# Patient Record
Sex: Male | Born: 2000 | Race: Black or African American | Hispanic: No | Marital: Single | Smoking: Never smoker
Health system: Southern US, Community
[De-identification: ages and names within clinical notes are randomized; demographics above are authoritative.]

## PROBLEM LIST (undated history)

## (undated) ENCOUNTER — Ambulatory Visit: Payer: PRIVATE HEALTH INSURANCE

## (undated) ENCOUNTER — Ambulatory Visit (HOSPITAL_COMMUNITY): Source: Home / Self Care

## (undated) DIAGNOSIS — E109 Type 1 diabetes mellitus without complications: Secondary | ICD-10-CM

## (undated) HISTORY — DX: Type 1 diabetes mellitus without complications: E10.9

---
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
## 2022-01-29 ENCOUNTER — Encounter (INDEPENDENT_AMBULATORY_CARE_PROVIDER_SITE_OTHER): Payer: Self-pay | Admitting: Pediatrics

## 2022-02-13 ENCOUNTER — Other Ambulatory Visit (INDEPENDENT_AMBULATORY_CARE_PROVIDER_SITE_OTHER): Payer: BC Managed Care – PPO | Admitting: Pharmacist

## 2022-02-13 NOTE — Progress Notes (Deleted)
? ?  S:    ? ?No chief complaint on file. ? ? ?Endocrinology provider: Dr. Charna Archer (upcoming appt 03/21/22 10:15 am) ? ?Patient has decided to initiate process to start an Omnipod 5 insulin pump. PMH significant for T1DM.  ? ?Patient presents today with ***. ? ?Insurance Coverage: *** ? ?DME Supplier: *** ? ?Preferred Pharmacy ?*** ? ?Medication Adherence ?-Patient {Actions; denies-reports:120008} adherence with medications.  ?-Current diabetes medications include: *** ?-Prior diabetes medications include: *** ? ? ?Pre-pump Topics ?Insulin Pump Basics ?Sick Day Management ?Pump Failure ?Travel  ?Pump Start Instructions  ? ?Prepump Survey Responses ?Email address: *** ?Long acting insulin, dose, time administered: *** ?Rapid acting insulin: *** ?Breakfast time: *** ?Lunch time: *** ?Dinner time: *** ?I wake up in the morning at: *** AM ?I go to bed at: *** ?I feel I need an insulin dose adjustment: *** ? ?Labs:  ? ? ?There were no vitals filed for this visit. ? ?HbA1c ?Lab Results  ?Component Value Date  ? HGBA1C >14 12/11/2021  ? HGBA1C 10.6 (A) 09/11/2021  ? HGBA1C 14.0 (A) 03/14/2021  ? ? ?Pancreatic Islet Cell Autoantibodies ?Lab Results  ?Component Value Date  ? ISLETAB Negative 12/08/2018  ? ? ?Insulin Autoantibodies ?Lab Results  ?Component Value Date  ? INSULINAB 6.3 (H) 12/08/2018  ? ? ?Glutamic Acid Decarboxylase Autoantibodies ?Lab Results  ?Component Value Date  ? GLUTAMICACAB 632.9 (H) 12/08/2018  ? ? ?ZnT8 Autoantibodies ?No results found for: ZNT8AB ? ?IA-2 Autoantibodies ?No results found for: LABIA2 ? ?C-Peptide ?Lab Results  ?Component Value Date  ? CPEPTIDE 0.7 (L) 12/08/2018  ? ? ?Microalbumin ?Lab Results  ?Component Value Date  ? MICRALBCREAT 6 03/14/2021  ? ? ?Lipids ?   ?Component Value Date/Time  ? CHOL 212 (H) 03/14/2021 1435  ? TRIG 143 (H) 03/14/2021 1435  ? HDL 56 03/14/2021 1435  ? CHOLHDL 3.8 03/14/2021 1435  ? LDLCALC 130 (H) 03/14/2021 1435  ? ? ?Assessment: ?Education - Thoroughly  discussed all pre-pump topics (insulin pump basics, sick day management, pump failure, travel, and pump start instructions). Patient/family had concerns related to ***; therefore, discussed topics in depth until family felt confident with understanding of topics.  ? ?Pump Start Instructions - Sent prescription for *** vial to patient's preferred pharmacy. The patient/family understand that the family should bring all insulin pump supplies as well as insulin vial to pump start appointment. Advised patient to *** long acting insulin on ***.  ? ?Plan: ?Pre-Pump Education ?Discussed all pre-pump topics (insulin pump basics, sick day management, pump failure, travel, and pump start instructions) until family felt confident in their understanding of each topic.  ?Pump Start Appointment ?Sent prescription for *** vial to patient's preferred pharmacy.  ?The patient/family understand that the family should bring all insulin pump supplies as well as insulin vial to pump start appointment.  ?Advised patient to *** long acting insulin on ***.  ?Follow Up:  ? ?Written patient instructions provided.   ? ?This appointment required *** minutes of patient care (this includes precharting, chart review, review of results, face-to-face care, etc.). ? ?Thank you for involving clinical pharmacist/diabetes educator to assist in providing this patient's care. ? ?Drexel Iha, PharmD, BCACP, [place], CPP ? ? ?

## 2022-02-26 ENCOUNTER — Telehealth (INDEPENDENT_AMBULATORY_CARE_PROVIDER_SITE_OTHER): Payer: Self-pay | Admitting: Pharmacist

## 2022-02-26 NOTE — Telephone Encounter (Signed)
Patient did not attend prepump appt on 02/13/22 therefore cannot attend pump start appt on 02/28/22 ? ?Please contact patient to reschedule prepump appt (after he attends this I will scheudle pump training appt) on Tues/Thurs 1:30, 2:30, 3:30 pm in April 2023. ? ?Thank you for involving clinical pharmacist/diabetes educator to assist in providing this patient's care.  ? ?Zachery Conch, PharmD, BCACP, CDCES, CPP ? ?

## 2022-02-27 ENCOUNTER — Encounter (INDEPENDENT_AMBULATORY_CARE_PROVIDER_SITE_OTHER): Payer: Self-pay | Admitting: Pharmacist

## 2022-02-28 ENCOUNTER — Other Ambulatory Visit (INDEPENDENT_AMBULATORY_CARE_PROVIDER_SITE_OTHER): Payer: BC Managed Care – PPO | Admitting: Pharmacist

## 2022-03-15 ENCOUNTER — Encounter (INDEPENDENT_AMBULATORY_CARE_PROVIDER_SITE_OTHER): Payer: Self-pay | Admitting: Pediatrics

## 2022-03-18 ENCOUNTER — Encounter (INDEPENDENT_AMBULATORY_CARE_PROVIDER_SITE_OTHER): Payer: Self-pay | Admitting: Pediatric Endocrinology

## 2022-03-21 ENCOUNTER — Ambulatory Visit (INDEPENDENT_AMBULATORY_CARE_PROVIDER_SITE_OTHER): Payer: BC Managed Care – PPO | Admitting: Pediatrics

## 2022-03-21 ENCOUNTER — Encounter (INDEPENDENT_AMBULATORY_CARE_PROVIDER_SITE_OTHER): Payer: Self-pay | Admitting: Pediatrics

## 2022-03-21 VITALS — BP 118/76 | HR 82 | Wt 142.8 lb

## 2022-03-21 DIAGNOSIS — Z794 Long term (current) use of insulin: Secondary | ICD-10-CM

## 2022-03-21 DIAGNOSIS — E10649 Type 1 diabetes mellitus with hypoglycemia without coma: Secondary | ICD-10-CM | POA: Diagnosis not present

## 2022-03-21 DIAGNOSIS — E1065 Type 1 diabetes mellitus with hyperglycemia: Secondary | ICD-10-CM | POA: Diagnosis not present

## 2022-03-21 LAB — POCT GLYCOSYLATED HEMOGLOBIN (HGB A1C): HbA1c POC (<> result, manual entry): 9.3 % (ref 4.0–5.6)

## 2022-03-21 LAB — POCT GLUCOSE (DEVICE FOR HOME USE): POC Glucose: 200 mg/dl — AB (ref 70–99)

## 2022-03-21 NOTE — Progress Notes (Signed)
Pediatric Endocrinology Consultation Follow-up Visit ? ?Michael [name] ?[birth year] ?[id number] ? ?Chief Complaint: type 1 diabetes ? ?HPI: ?Michael [name] is a 21 y.o. male presenting for follow-up of the above concerns.  he attended this visit alone. ? ?1. Omari was diagnosed with T1DM on 12/08/2018 (presented to [facility] with DKA initially).  At presentation, A1c elevated at 11.2%, C-peptide low at 0.7, islet cell ab negative, insulin Ab elevated at 6.3 (normal <5), GAD ab positive at 632.9 (normal <5), celiac screen negative.  He was started on an MDI regimen and discharged from the hospital on 12/11/18.  He started on a dexcom CGM in 01/2019. ? ?2. Since last visit on 12/11/21, he has been OK.   ?ED visits/Hospitalizations: None ? ?Concerns:  ?-Having many lows recently.  Sent mychart message while I was out of office and reports Dr. Baldo Ash told him to come to his appt.  (Dr. Baldo Ash also recommended decreasing his long-acting insulin dose but he did not read this mychart message). ?-He thinks lows are due to not eating as many carbs recently. ?-Was set up to get omnipod 5 training though mom and grandmother are skeptical and told him not to get a pump.  He still wants one and may get one.  Discussed training process. ? ?Insulin regimen:  ?Tresiba 28 units daily before bed (10PM) ?Novolog 120/30/6 plan.  ?BF-8 units ?L-Doesn't eat next meal until 6PM.  Sandwich for dinner ?D- 9 units  ? ?CGM download:  ? ? ?Many lows overnight, needs less tresiba ? ?Hypoglycemia: can feel lows, many recently.  No glucagon needed recently. Has Baqsimi ?Wearing Med-alert ID currently: has one in car ?Injection sites: Legs; hurts less than arms ?Annual labs due: 03/2022-Will draw at next visit ?Ophthalmology due: Has appt Tuesday. ? ?ROS: ?All systems reviewed with pertinent positives listed below; otherwise negative. ?Constitutional: Weight has increased 4lb since last visit.     ? ?Working 4 days per week (M-Th) ?[employer] 9AM-2PM, other  job 3P-1AM  ? ?Works out Fri-Sun ? 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 ?Past Medical History:   ?Past Medical History:  ?Diagnosis Date  ? Type 1 diabetes ([place])   ? Dx 12/2018, presented with DKA.  + Insulin Ab and GAD Ab.  Low C-peptide.  Negative celiac screen.  ? ? ?Meds: ?Outpatient Encounter Medications as of 03/21/2022  ?Medication Sig  ? BD PEN NEEDLE NANO 2ND GEN 32G X 4 MM MISC USE WITH INSULIN PEN 6 TIMES DAILY  ? Continuous Blood Gluc Sensor (DEXCOM G6 SENSOR) MISC WEAR CONTINUOSLY FOR 10 DAYS THEN REMOVE AND PLACE NEW SENSOR  ? Continuous Blood Gluc Transmit (DEXCOM G6 TRANSMITTER) MISC Use with Dexcom Sensor, reuse for 3 months  ? insulin degludec (TRESIBA FLEXTOUCH) 100 UNIT/ML FlexTouch Pen INJECT AS DIRECTED BY MD, UP TO 50 UNITS DAILY  ? NOVOLOG FLEXPEN 100 UNIT/ML FlexPen INJECT UP TO 50 UNITS DAILY PER PROVIDER INSTRUCTION  ? ACCU-CHEK FASTCLIX LANCETS MISC Check sugar 10 x daily (Patient not taking: Reported on 03/21/2022)  ? acetaminophen (TYLENOL) 500 MG tablet Take 500 mg by mouth every 6 (six) hours as needed for mild pain. (Patient not taking: Reported on 03/21/2022)  ? acetone, urine, test strip Check ketones per protocol (Patient not taking: Reported on 12/11/2021)  ? Alcohol Swabs (ALCOHOL PADS) 70 % PADS Wipe skin prior to injection (Patient not taking: Reported on 03/21/2022)  ? Glucagon (BAQSIMI TWO PACK) 3 MG/DOSE POWD Place 1 application into the nose as  needed. Use as directed if unconscious, unable to take food po, or having a seizure due to hypoglycemia (Patient not taking: Reported on 12/11/2021)  ? glucose blood (ACCU-CHEK GUIDE) test strip Use to check BG 6 times daily (Patient not taking: Reported on 03/21/2022)  ? insulin aspart (NOVOLOG) 100 UNIT/ML injection Inject up to 200 units into insulin pump every 2 days. Please fill for insulin VIAL. (Patient not taking: Reported on 03/21/2022)  ? Insulin Disposable Pump (OMNIPOD 5 G6 INTRO, GEN 5,) KIT Inject 1 kit  into the skin as directed. . Change pod every 2 days. Intro kit comes with 2 boxes of pods, PDM device, pod pals, and user manual. Please fill for Omnipod 5 Into kit NDC [phone] (Patient not taking: Reported on 03/21/2022)  ? Insulin Glargine (LANTUS SOLOSTAR) 100 UNIT/ML Solostar Pen Up to 50 units per day as directed by MD (Patient not taking: Reported on 12/11/2021)  ? Multiple Vitamins-Minerals (MULTIVITAMIN ADULT, MINERALS,) TABS Take by mouth. (Patient not taking: Reported on 03/21/2022)  ? Nutritional Supplements (CREATINE) 750 MG CAPS Take by mouth. (Patient not taking: Reported on 03/21/2022)  ? ?No facility-administered encounter medications on file as of 03/21/2022.  ? ?Allergies: ?Allergies  ?Allergen Reactions  ? Penicillins Hives and Swelling  ?  DID THE REACTION INVOLVE: Swelling of the face/tongue/throat, SOB, or low BP? Yes ?Sudden or severe rash/hives, skin peeling, or the inside of the mouth or nose? Yes ?Did it require medical treatment? Yes ?When did it last happen?       ?If all above answers are ?NO?, may proceed with cephalosporin use. ?  ? ?Surgical History: ?History reviewed. No pertinent surgical history.  ? ?Family History:  ?Family History  ?Problem Relation Age of Onset  ? Diabetes Paternal Aunt   ? Diabetes Paternal Uncle   ? Diabetes Paternal Grandmother   ? 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 ?Physical Exam:  ?Vitals:  ? 03/21/22 1005  ?BP: 118/76  ?Pulse: 82  ?Weight: 142 lb 12.8 oz (64.8 kg)  ? ? ?BP 118/76   Pulse 82   Wt 142 lb 12.8 oz (64.8 kg)   BMI 21.89 kg/m?  ?Body mass index: body mass index is 21.89 kg/m?. ?Blood pressure percentiles are not available for patients who are 18 years or older. ? ?Wt Readings from Last 3 Encounters:  ?03/21/22 142 lb 12.8 oz (64.8 kg) (33 %, Z= -0.43)*  ?12/11/21 138 lb (62.6 kg) (27 %, Z= -0.62)*  ?09/11/21 145 lb (65.8 kg) (40 %, Z= -0.24)*  ? ?* Growth  percentiles are based on CDC (Boys, 2-20 Years) data.  ? ?Ht Readings from Last 3 Encounters:  ?12/11/21 5' 7.72" (1.72 m) (26 %, Z= -0.63)*  ?09/11/21 5' 7.56" (1.716 m) (25 %, Z= -0.68)*  ?11/09/20 5' 8.5" (1.74 m) (39 %, Z= -0.27)*  ? ?* Growth percentiles are based on CDC (Boys, 2-20 Years) data.  ? ?General: Well developed, well nourished male in no acute distress.  Appears stated age ?Head: Normocephalic, atraumatic.   ?Eyes:  Pupils equal and round. EOMI.  Sclera white.  No eye drainage.   ?Ears/Nose/Mouth/Throat: Nares patent, no nasal drainage.  Normal dentition, mucous membranes moist.  ?Neck: supple, no cervical lymphadenopathy, no thyromegaly ?Cardiovascular: regular rate, normal S1/S2, no murmurs ?Respiratory: No increased work of breathing.  Lungs clear to auscultation bilaterally.  No wheezes. ?Abdomen: soft, nontender, nondistended. Normal bowel sounds.  No appreciable masses  ?  Extremities: warm, well perfused, cap refill < 2 sec.   ?Musculoskeletal: Normal muscle mass.  Normal strength ?Skin: warm, dry.  No rash or lesions. ?Neurologic: alert and oriented, normal speech, no tremor  ? ?Labs: ?Results for orders placed or performed in visit on 03/21/22  ?POCT Glucose (Device for Home Use)  ?Result Value Ref Range  ? Glucose Fasting, POC    ? POC Glucose 200 (A) 70 - 99 mg/dl  ?POCT glycosylated hemoglobin (Hb A1C)  ?Result Value Ref Range  ? Hemoglobin A1C    ? HbA1c POC (<> result, manual entry) 9.3 4.0 - 5.6 %  ? HbA1c, POC (prediabetic range)    ? HbA1c, POC (controlled diabetic range)    ? ?A1c trend: 11.7% 12/2018 at diagnosis--> 11.2% 08/2019--> 12.9% 11/2019--> 9.4% 01/2020--> 10.2% 05/2020-->9.7% 07/2020--> 8.2% 09/2020--> 9% 11/09/20--> 14% 03/2021--> 10.6% 09/2021--> >14% 12/2021-->9.3% 03/2022 ? ?Assessment/Plan: ?Desmond Szabo is a 21 y.o. male with T1DM on an MDI and CGM regimen.   ?A1c is lower than last visit and is above the ADA goal of <7.0%. Dexcom tracing shows he is not meeting goal of TIR  >70%. he needs less tresiba.   ? ?When a patient is on insulin, intensive monitoring of blood glucose levels and continuous insulin titration is vital to avoid insulin toxicity leading to severe hypoglycemia. Se

## 2022-03-21 NOTE — Patient Instructions (Addendum)
It was a pleasure to see you in clinic today.   ?Feel free to contact our office during normal business hours at [phone] with questions or concerns. ?If you need [place] urgently after normal business hours, please call the above number to reach our answering service who will contact the on-call pediatric endocrinologist. ? ?If you choose to communicate with [place] via MyChart, please do not send urgent messages as this inbox is NOT monitored on nights or weekends.  Urgent concerns should be discussed with the on-call pediatric endocrinologist. ? ?-Always have fast sugar with you in case of low blood sugar (glucose tabs, regular juice or soda, candy) ?-Always wear your ID that states you have diabetes ?-Always bring your meter/continuous glucose monitor to your visit ?-Call/Email if you want to review blood sugars ? ? ?Reduce tresiba to 25 units daily ?

## 2022-03-22 ENCOUNTER — Other Ambulatory Visit (INDEPENDENT_AMBULATORY_CARE_PROVIDER_SITE_OTHER): Payer: Self-pay | Admitting: Pediatrics

## 2022-03-22 DIAGNOSIS — E1010 Type 1 diabetes mellitus with ketoacidosis without coma: Secondary | ICD-10-CM

## 2022-04-08 ENCOUNTER — Other Ambulatory Visit (INDEPENDENT_AMBULATORY_CARE_PROVIDER_SITE_OTHER): Payer: Self-pay | Admitting: Pediatrics

## 2022-04-08 DIAGNOSIS — E109 Type 1 diabetes mellitus without complications: Secondary | ICD-10-CM

## 2022-05-28 ENCOUNTER — Other Ambulatory Visit (INDEPENDENT_AMBULATORY_CARE_PROVIDER_SITE_OTHER): Payer: Self-pay | Admitting: Pediatrics

## 2022-05-28 DIAGNOSIS — E1010 Type 1 diabetes mellitus with ketoacidosis without coma: Secondary | ICD-10-CM

## 2022-06-19 ENCOUNTER — Encounter (INDEPENDENT_AMBULATORY_CARE_PROVIDER_SITE_OTHER): Payer: Self-pay | Admitting: Pediatrics

## 2022-06-19 ENCOUNTER — Ambulatory Visit (INDEPENDENT_AMBULATORY_CARE_PROVIDER_SITE_OTHER): Payer: BC Managed Care – PPO | Admitting: Pediatrics

## 2022-06-19 VITALS — BP 122/72 | HR 80 | Wt 142.0 lb

## 2022-06-19 DIAGNOSIS — E1065 Type 1 diabetes mellitus with hyperglycemia: Secondary | ICD-10-CM | POA: Diagnosis not present

## 2022-06-19 DIAGNOSIS — Z794 Long term (current) use of insulin: Secondary | ICD-10-CM

## 2022-06-19 LAB — POCT GLYCOSYLATED HEMOGLOBIN (HGB A1C): Hemoglobin A1C: 9.1 % — AB (ref 4.0–5.6)

## 2022-06-19 LAB — POCT GLUCOSE (DEVICE FOR HOME USE): POC Glucose: 47 mg/dl — AB (ref 70–99)

## 2022-06-19 NOTE — Patient Instructions (Addendum)
It was a pleasure to see you in clinic today.   Feel free to contact our office during normal business hours at [phone] with questions or concerns. If you have an emergency after normal business hours, please call the above number to reach our answering service who will contact the on-call pediatric endocrinologist.  If you choose to communicate with [place] via MyChart, please do not send urgent messages as this inbox is NOT monitored on nights or weekends.  Urgent concerns should be discussed with the on-call pediatric endocrinologist.  -Always have fast sugar with you in case of low blood sugar (glucose tabs, regular juice or soda, candy) -Always wear your ID that states you have diabetes -Always bring your meter/continuous glucose monitor to your visit -Call/Email if you want to review blood sugars   Cut tresiba dose to 25 units daily

## 2022-06-19 NOTE — Progress Notes (Addendum)
Pediatric Endocrinology Consultation Follow-up Visit  Michael [name] Oct 19, 2001 [id number]  Chief Complaint: type 1 diabetes  HPI: Michael [name] is a 21 y.o. male presenting for follow-up of the above concerns.  he attended this visit alone.  28. Michael [name] was diagnosed with T1DM on 12/08/2018 (presented to [facility] with DKA initially).  At presentation, A1c elevated at 11.2%, C-peptide low at 0.7, islet cell ab negative, insulin Ab elevated at 6.3 (normal <5), GAD ab positive at 632.9 (normal <5), celiac screen negative.  He was started on an MDI regimen and discharged from the hospital on 12/11/18.  He started on a dexcom CGM in 01/2019.  2. Since last visit on 03/21/22, he has been well.   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   -Got his own apartment -Playing in 2 basketball leagues Not going back to college -Interested in a closed loop pump  Insulin regimen:  Tresiba 28 units daily before bed (10PM) Having a lot of lows, more than he's had in the past.  Still eating well. Novolog 120/30/6 plan.  BF-8 units ([employer]), hashbrowns, SEC biscuit, OJ L-at 2PM, fast food D- home cooked meals, rice, takes 10 units   CGM download:    Interpretation: having many lows, needs less tresiba  Hypoglycemia: can feel lows, many recently.  No glucagon needed recently. Has Baqsimi Wearing Med-alert ID currently: not currently Injection sites: Leg, arms Annual labs due: 03/2022-Will draw today Ophthalmology due: Recent visit- no retina concerns  ROS: All systems reviewed with pertinent positives listed below; otherwise negative. Constitutional: Weight has stable from last visit. Eating out more, home cooked meal  Past Medical History:   Past Medical History:  Diagnosis Date   Type 1 diabetes ([place])    Dx 12/2018, presented with DKA.  + Insulin Ab and GAD Ab.  Low C-peptide.  Negative celiac screen.    Meds: Outpatient Encounter Medications  as of 06/19/2022  Medication Sig   acetaminophen (TYLENOL) 500 MG tablet Take 500 mg by mouth every 6 (six) hours as needed for mild pain.   Alcohol Swabs (ALCOHOL PADS) 70 % PADS Wipe skin prior to injection   BD PEN NEEDLE NANO 2ND GEN 32G X 4 MM MISC USE WITH INSULIN PEN 6 TIMES DAILY   Continuous Blood Gluc Sensor (DEXCOM G6 SENSOR) MISC WEAR CONTINUOUSLY FOR 10 DAYS TABLET;HEN REMOVE AND REPLACE WITH A NEW SENSOR   Continuous Blood Gluc Transmit (DEXCOM G6 TRANSMITTER) MISC USE WITH DEXCOM SENSOR, REUSE FOR 3 MONTHS   insulin degludec (TRESIBA FLEXTOUCH) 100 UNIT/ML FlexTouch Pen ADMINISTER UP TO 50 UNITS UNDER THE SKIN DAILY AS DIRECTED BY PRESCRIBER   Multiple Vitamins-Minerals (MULTIVITAMIN ADULT, MINERALS,) TABS Take by mouth.   NOVOLOG FLEXPEN 100 UNIT/ML FlexPen INJECT UP TO 50 UNITS DAILY PER PROVIDER INSTRUCTION   ACCU-CHEK FASTCLIX LANCETS MISC Check sugar 10 x daily (Patient not taking: Reported on 03/21/2022)   acetone, urine, test strip Check ketones per protocol (Patient not taking: Reported on 12/11/2021)   Glucagon (BAQSIMI TWO PACK) 3 MG/DOSE POWD Place 1 application into the nose as needed. Use as directed if unconscious, unable to take food po, or having a seizure due to hypoglycemia (Patient not taking: Reported on 12/11/2021)   glucose blood (ACCU-CHEK GUIDE) test strip Use to check BG 6 times daily (Patient not taking: Reported on 03/21/2022)   insulin aspart (NOVOLOG) 100 UNIT/ML injection Inject up to 200 units into insulin pump every 2 days. Please fill for insulin  VIAL. (Patient not taking: Reported on 06/19/2022)   Insulin Disposable Pump (OMNIPOD 5 G6 INTRO, GEN 5,) KIT Inject 1 kit into the skin as directed. . Change pod every 2 days. Intro kit comes with 2 boxes of pods, PDM device, pod pals, and user manual. Please fill for Omnipod 5 Into kit NDC [phone] (Patient not taking: Reported on 03/21/2022)   Insulin Glargine (LANTUS SOLOSTAR) 100 UNIT/ML Solostar Pen Up to  50 units per day as directed by MD (Patient not taking: Reported on 12/11/2021)   [DISCONTINUED] Nutritional Supplements (CREATINE) 750 MG CAPS Take by mouth. (Patient not taking: Reported on 03/21/2022)   No facility-administered encounter medications on file as of 06/19/2022.   Allergies: Allergies  Allergen Reactions   Penicillins Hives and Swelling    DID THE REACTION INVOLVE: Swelling of the face/tongue/throat, SOB, or low BP? Yes Sudden or severe rash/hives, skin peeling, or the inside of the mouth or nose? Yes Did it require medical treatment? Yes When did it last happen?       If all above answers are "NO", may proceed with cephalosporin use.    Surgical History: History reviewed. No pertinent surgical history.   Family History:  Family History  Problem Relation Age of Onset   Diabetes Paternal Aunt    Diabetes Paternal Uncle    Diabetes Paternal Grandmother    Social History: Lives with: parents  Physical Exam:  Vitals:   06/19/22 0922  BP: 122/72  Pulse: 80  Weight: 142 lb (64.4 kg)    BP 122/72   Pulse 80   Wt 142 lb (64.4 kg)   BMI 21.77 kg/m  Body mass index: body mass index is 21.77 kg/m. Blood pressure %iles are not available for patients who are 18 years or older.  Wt Readings from Last 3 Encounters:  06/19/22 142 lb (64.4 kg) (30 %, Z= -0.51)*  03/21/22 142 lb 12.8 oz (64.8 kg) (33 %, Z= -0.43)*  12/11/21 138 lb (62.6 kg) (27 %, Z= -0.62)*   * Growth percentiles are based on CDC (Boys, 2-20 Years) data.   Ht Readings from Last 3 Encounters:  12/11/21 5' 7.72" (1.72 m) (26 %, Z= -0.63)*  09/11/21 5' 7.56" (1.716 m) (25 %, Z= -0.68)*  11/09/20 5' 8.5" (1.74 m) (39 %, Z= -0.27)*   * Growth percentiles are based on CDC (Boys, 2-20 Years) data.   General: Well developed, well nourished male in no acute distress.  Appears stated age Head: Normocephalic, atraumatic.   Eyes:  Pupils equal and round. EOMI.   Sclera white.  No eye drainage.    Ears/Nose/Mouth/Throat: Nares patent, no nasal drainage.  Moist mucous membranes, normal dentition Neck: supple, no cervical lymphadenopathy, no thyromegaly Cardiovascular: regular rate, normal S1/S2, no murmurs Respiratory: No increased work of breathing.  Lungs clear to auscultation bilaterally.  No wheezes. Abdomen: soft, nontender, nondistended.  Extremities: warm, well perfused, cap refill < 2 sec.   Musculoskeletal: Normal muscle mass.  Normal strength Skin: warm, dry.  No rash or lesions. Skin normal at injection sites Neurologic: alert and oriented, normal speech, no tremor   Labs: Results for orders placed or performed in visit on 06/19/22  POCT Glucose (Device for Home Use)  Result Value Ref Range   Glucose Fasting, POC     POC Glucose 47 (A) 70 - 99 mg/dl  POCT glycosylated hemoglobin (Hb A1C)  Result Value Ref Range   Hemoglobin A1C 9.1 (A) 4.0 - 5.6 %   HbA1c POC (<>  result, manual entry)     HbA1c, POC (prediabetic range)     HbA1c, POC (controlled diabetic range)    Repeat BG 88 at 9:45AM  A1c trend: 11.7% 12/2018 at diagnosis--> 11.2% 08/2019--> 12.9% 11/2019--> 9.4% 01/2020--> 10.2% 05/2020-->9.7% 07/2020--> 8.2% 09/2020--> 9% 11/09/20--> 14% 03/2021--> 10.6% 09/2021--> >14% 12/2021-->9.3% 03/2022--> 9.1% 06/2022  Assessment/Plan: Michael [name] is a 21 y.o. male with T1DM on an MDI and intermittent CGM regimen.   A1c is lower than last visit and is above the ADA goal of <7.0%. Dexcom tracing shows he is not meeting goal of TIR >70%. he needs less tresiba.  He is having too many lows and would benefit from a closed loop pump.    When a patient is on insulin, intensive monitoring of blood glucose levels and continuous insulin titration is vital to avoid insulin toxicity leading to severe hypoglycemia. Severe hypoglycemia can lead to seizure or death. Hyperglycemia can also result from inadequate insulin dosing and can lead to ketosis requiring ICU admission and intravenous  insulin.   1. Type 1 diabetes with hyperglycemia - POCT Glucose and POCT HgB A1C as above -Will draw annual diabetes labs today (lipid panel, TSH, FT4, urine microalbumin to creatinine ratio) -Encouraged to wear med alert ID every day -Encouraged to rotate injection sites -Provided with my contact information and advised to email/send mychart with questions/need for BG review -CGM download reviewed extensively (see interpretation above) -Interested in a pump- advised to schedule with [facility] for pre-pump  2. Insulin dose change -Made the following insulin changes: Reduce tresiba to 25 units Let me know if he is still running low so I can make adjustments   Follow-up:   Return in about 3 months (around 09/19/2022).   >30 minutes spent today reviewing the medical chart, counseling the patient/family, and documenting today's encounter.  Levon Hedger, MD  -------------------------------- 06/20/22 8:42 AM ADDENDUM: Results for orders placed or performed in visit on 06/19/22  Lipid panel  Result Value Ref Range   Cholesterol 242 (H) <170 mg/dL   HDL 55 >45 mg/dL   Triglycerides 71 <90 mg/dL   LDL Cholesterol (Calc) 170 (H) <110 mg/dL (calc)   Total CHOL/HDL Ratio 4.4 <5.0 (calc)   Non-HDL Cholesterol (Calc) 187 (H) <120 mg/dL (calc)  Microalbumin / creatinine urine ratio  Result Value Ref Range   Creatinine, Urine 276 20 - 320 mg/dL   Microalb, Ur 1.1 mg/dL   Microalb Creat Ratio 4 <30 mcg/mg creat  T4, free  Result Value Ref Range   Free T4 1.1 0.8 - 1.4 ng/dL  TSH  Result Value Ref Range   TSH 1.10 0.50 - 4.30 mIU/L  POCT Glucose (Device for Home Use)  Result Value Ref Range   Glucose Fasting, POC     POC Glucose 47 (A) 70 - 99 mg/dl  POCT glycosylated hemoglobin (Hb A1C)  Result Value Ref Range   Hemoglobin A1C 9.1 (A) 4.0 - 5.6 %   HbA1c POC (<> result, manual entry)     HbA1c, POC (prediabetic range)     HbA1c, POC (controlled diabetic range)    Sent  the following mychart message:  Hi, Your thyroid labs and urine protein levels are normal. Your cholesterol has increased and your LDL (the "bad" cholesterol) has increased and is currently at a point that cholesterol medicine should be started.  However, before we start a cholesterol medicine, I want you to work on eating out less, cutting back on fried foods, and  work on getting your blood sugars under better control.  Please do those things for the next 3 months and we will repeat your lipid panel at next visit.  If your levels are still too high at that point, we will start a cholesterol medicine. Please let me know if you have questions! Dr. Charna Archer

## 2022-06-20 LAB — TSH: TSH: 1.1 mIU/L (ref 0.50–4.30)

## 2022-06-20 LAB — LIPID PANEL
Cholesterol: 242 mg/dL — ABNORMAL HIGH (ref <170)
HDL: 55 mg/dL (ref >45)
LDL Cholesterol (Calc): 170 mg/dL (calc) — ABNORMAL HIGH (ref <110)
Non-HDL Cholesterol (Calc): 187 mg/dL (calc) — ABNORMAL HIGH (ref <120)
Total CHOL/HDL Ratio: 4.4 (calc) (ref <5.0)
Triglycerides: 71 mg/dL (ref <90)

## 2022-06-20 LAB — T4, FREE: Free T4: 1.1 ng/dL (ref 0.8–1.4)

## 2022-06-20 LAB — MICROALBUMIN / CREATININE URINE RATIO
Creatinine, Urine: 276 mg/dL (ref 20–320)
Microalb Creat Ratio: 4 mcg/mg creat (ref <30)
Microalb, Ur: 1.1 mg/dL

## 2022-07-03 ENCOUNTER — Encounter (INDEPENDENT_AMBULATORY_CARE_PROVIDER_SITE_OTHER): Payer: Self-pay

## 2022-07-04 ENCOUNTER — Other Ambulatory Visit (INDEPENDENT_AMBULATORY_CARE_PROVIDER_SITE_OTHER): Payer: BC Managed Care – PPO | Admitting: Pharmacist

## 2022-07-10 ENCOUNTER — Telehealth (INDEPENDENT_AMBULATORY_CARE_PROVIDER_SITE_OTHER): Payer: Self-pay

## 2022-07-10 NOTE — Telephone Encounter (Addendum)
Dexcom sensors, APPROVED  through 07/11/2023 Fax sent to pharmacy to have them fill refill asap

## 2022-07-10 NOTE — Telephone Encounter (Signed)
Received fax from [place], [place] needed for Dexcom G6 Sensor. Initiated PA on covermymeds:

## 2022-07-22 ENCOUNTER — Telehealth (INDEPENDENT_AMBULATORY_CARE_PROVIDER_SITE_OTHER): Payer: Self-pay

## 2022-07-22 NOTE — Telephone Encounter (Signed)
Received fax from [facility] stating, PA needed for [place]. Initiated PA in Covermyemeds:

## 2022-07-23 NOTE — Telephone Encounter (Addendum)
  TRESIBA FLEX INJ [id number] is approved through 07/23/2023  Fax will be sent to pharmacy to update of approval

## 2022-09-19 ENCOUNTER — Ambulatory Visit (INDEPENDENT_AMBULATORY_CARE_PROVIDER_SITE_OTHER): Payer: BC Managed Care – PPO | Admitting: Pediatrics

## 2022-10-08 ENCOUNTER — Ambulatory Visit (INDEPENDENT_AMBULATORY_CARE_PROVIDER_SITE_OTHER): Payer: Medicaid Other | Admitting: Pediatrics

## 2022-10-08 ENCOUNTER — Encounter (INDEPENDENT_AMBULATORY_CARE_PROVIDER_SITE_OTHER): Payer: Self-pay | Admitting: Pediatrics

## 2022-10-08 ENCOUNTER — Encounter (INDEPENDENT_AMBULATORY_CARE_PROVIDER_SITE_OTHER): Payer: Self-pay | Admitting: Pharmacist

## 2022-10-08 VITALS — BP 120/72 | HR 76 | Wt 142.0 lb

## 2022-10-08 DIAGNOSIS — E1065 Type 1 diabetes mellitus with hyperglycemia: Secondary | ICD-10-CM | POA: Diagnosis not present

## 2022-10-08 LAB — POCT GLYCOSYLATED HEMOGLOBIN (HGB A1C): HbA1c POC (<> result, manual entry): 8.5 % (ref 4.0–5.6)

## 2022-10-08 LAB — POCT GLUCOSE (DEVICE FOR HOME USE): POC Glucose: 76 mg/dl (ref 70–99)

## 2022-10-08 LAB — LIPID PANEL
Cholesterol: 175 mg/dL — ABNORMAL HIGH (ref <170)
HDL: 41 mg/dL — ABNORMAL LOW (ref >45)
LDL Cholesterol (Calc): 120 mg/dL (calc) — ABNORMAL HIGH (ref <110)
Non-HDL Cholesterol (Calc): 134 mg/dL (calc) — ABNORMAL HIGH (ref <120)
Total CHOL/HDL Ratio: 4.3 (calc) (ref <5.0)
Triglycerides: 52 mg/dL (ref <90)

## 2022-10-08 NOTE — Patient Instructions (Addendum)
It was a pleasure to see you in clinic today.   Feel free to contact our office during normal business hours at [phone] with questions or concerns. If you have an emergency after normal business hours, please call the above number to reach our answering service who will contact the on-call pediatric endocrinologist.  If you choose to communicate with [place] via [place], please do not send urgent messages as this inbox is NOT monitored on nights or weekends.  Urgent concerns should be discussed with the on-call pediatric endocrinologist.  -Always have fast sugar with you in case of low blood sugar (glucose tabs, regular juice or soda, candy) -Always wear your ID that states you have diabetes -Always bring your meter/continuous glucose monitor to your visit -Call/Email if you want to review blood sugars   Reduce tresiba to 24 units daily

## 2022-10-08 NOTE — Progress Notes (Signed)
Pediatric Endocrinology Consultation Follow-up Visit  Michael [name] [birth year] [id number]  Chief Complaint: type 1 diabetes  HPI: Michael [name] is a 21 y.o. male presenting for follow-up of the above concerns.  he attended this visit alone.  37. Michael [name] was diagnosed with T1DM on 12/08/2018 (presented to [facility] with DKA initially).  At presentation, A1c elevated at 11.2%, C-peptide low at 0.7, islet cell ab negative, insulin Ab elevated at 6.3 (normal <5), GAD ab positive at 632.9 (normal <5), celiac screen negative.  He was started on an MDI regimen and discharged from the hospital on 12/11/18.  He started on a dexcom CGM in 01/2019.  2. Since last visit on 06/19/22, he has been well.   ED visits/Hospitalizations: None  Concerns:  -Moved to [employer], lives there and is playing basketball there.    -Interested in getting the omnipod 5, has had to cancel several appts with Dr. Lovena Le to start this.  Insulin regimen:  Tresiba 26 units daily before bed (10PM).  Lows have been in the 70s.  After running and lifting weights.  Thinks this may be too high of a dose as he is often in the 90-100s upon waking.  Novolog 120/30/6 plan. Hasn't been eating as "crazy" but is eating a good amount (less than when he attended [facility] [facility]).   BF-peanut butter crackers, usually doesn't bolus L- chipotle bowl D- sub sandwich  CGM download:  Has not had it; will pick up a supply from his mom today  Forgot meter at home  Hypoglycemia: doesn't feel lows.  No glucagon needed recently. Has Baqsimi Wearing Med-alert ID currently: not currently Injection sites: Leg, arms Annual labs due: 06/2023; due for repeat lipid panel today Ophthalmology due: Recent visit- no retina concerns.  Due Summer 2024  ROS: All systems reviewed with pertinent positives listed below; otherwise negative. Constitutional: Weight stable since last visit.     Past Medical History:   Past Medical History:   Diagnosis Date   Type 1 diabetes ([place])    Dx 12/2018, presented with DKA.  + Insulin Ab and GAD Ab.  Low C-peptide.  Negative celiac screen.    Meds: Outpatient Encounter Medications as of 10/08/2022  Medication Sig   ACCU-CHEK FASTCLIX LANCETS MISC Check sugar 10 x daily   acetaminophen (TYLENOL) 500 MG tablet Take 500 mg by mouth every 6 (six) hours as needed for mild pain.   Alcohol Swabs (ALCOHOL PADS) 70 % PADS Wipe skin prior to injection   BD PEN NEEDLE NANO 2ND GEN 32G X 4 MM MISC USE WITH INSULIN PEN 6 TIMES DAILY   glucose blood (ACCU-CHEK GUIDE) test strip Use to check BG 6 times daily   insulin degludec (TRESIBA FLEXTOUCH) 100 UNIT/ML FlexTouch Pen ADMINISTER UP TO 50 UNITS UNDER THE SKIN DAILY AS DIRECTED BY PRESCRIBER   Multiple Vitamins-Minerals (MULTIVITAMIN ADULT, MINERALS,) TABS Take by mouth.   NOVOLOG FLEXPEN 100 UNIT/ML FlexPen INJECT UP TO 50 UNITS DAILY PER PROVIDER INSTRUCTION   acetone, urine, test strip Check ketones per protocol (Patient not taking: Reported on 12/11/2021)   Continuous Blood Gluc Sensor (DEXCOM G6 SENSOR) MISC WEAR CONTINUOUSLY FOR 10 DAYS TABLET;HEN REMOVE AND REPLACE WITH A NEW SENSOR (Patient not taking: Reported on 10/08/2022)   Continuous Blood Gluc Transmit (DEXCOM G6 TRANSMITTER) MISC USE WITH DEXCOM SENSOR, REUSE FOR 3 MONTHS (Patient not taking: Reported on 10/08/2022)   Glucagon (BAQSIMI TWO PACK) 3 MG/DOSE POWD Place 1 application into the nose as needed. Use as  directed if unconscious, unable to take food po, or having a seizure due to hypoglycemia (Patient not taking: Reported on 10/08/2022)   insulin aspart (NOVOLOG) 100 UNIT/ML injection Inject up to 200 units into insulin pump every 2 days. Please fill for insulin VIAL. (Patient not taking: Reported on 06/19/2022)   Insulin Disposable Pump (OMNIPOD 5 G6 INTRO, GEN 5,) KIT Inject 1 kit into the skin as directed. . Change pod every 2 days. Intro kit comes with 2 boxes of pods, PDM device, pod  pals, and user manual. Please fill for Omnipod 5 Into kit NDC [phone] (Patient not taking: Reported on 03/21/2022)   Insulin Glargine (LANTUS SOLOSTAR) 100 UNIT/ML Solostar Pen Up to 50 units per day as directed by MD (Patient not taking: Reported on 12/11/2021)   No facility-administered encounter medications on file as of 10/08/2022.   Allergies: Allergies  Allergen Reactions   Penicillins Hives and Swelling    DID THE REACTION INVOLVE: Swelling of the face/tongue/throat, SOB, or low BP? Yes Sudden or severe rash/hives, skin peeling, or the inside of the mouth or nose? Yes Did it require medical treatment? Yes When did it last happen?       If all above answers are "NO", may proceed with cephalosporin use.    Surgical History: History reviewed. No pertinent surgical history.   Family History:  Family History  Problem Relation Age of Onset   Diabetes Paternal Aunt    Diabetes Paternal Uncle    Diabetes Paternal Grandmother    Social History: Social History   Social History Narrative   Attending [employer], playing basketball there.    Physical Exam:  Vitals:   10/08/22 0920  BP: 120/72  Pulse: 76  Weight: 142 lb (64.4 kg)    BP 120/72 (BP Location: Left Arm, Patient Position: Sitting, Cuff Size: Large)   Pulse 76   Wt 142 lb (64.4 kg)   BMI 21.77 kg/m  Body mass index: body mass index is 21.77 kg/m. Blood pressure %iles are not available for patients who are 18 years or older.  Wt Readings from Last 3 Encounters:  10/08/22 142 lb (64.4 kg) (29 %, Z= -0.55)*  06/19/22 142 lb (64.4 kg) (30 %, Z= -0.51)*  03/21/22 142 lb 12.8 oz (64.8 kg) (33 %, Z= -0.43)*   * Growth percentiles are based on CDC (Boys, 2-20 Years) data.   Ht Readings from Last 3 Encounters:  12/11/21 5' 7.72" (1.72 m) (26 %, Z= -0.63)*  09/11/21 5' 7.56" (1.716 m) (25 %, Z= -0.68)*  11/09/20 5' 8.5" (1.74 m) (39 %, Z= -0.27)*   * Growth percentiles are based on CDC  (Boys, 2-20 Years) data.   General: Well developed, well nourished male in no acute distress.  Appears stated age Head: Normocephalic, atraumatic.   Eyes:  Pupils equal and round. EOMI.   Sclera white.  No eye drainage.   Ears/Nose/Mouth/Throat: Nares patent, no nasal drainage.  Moist mucous membranes, normal dentition Neck: supple, no cervical lymphadenopathy, no thyromegaly Cardiovascular: regular rate, normal S1/S2, no murmurs Respiratory: No increased work of breathing.  Lungs clear to auscultation bilaterally.  No wheezes. Abdomen: soft, nontender, nondistended.  Extremities: warm, well perfused, cap refill < 2 sec.   Musculoskeletal: Normal muscle mass.  Normal strength Skin: warm, dry.  No rash or lesions. Neurologic: alert and oriented, normal speech, no tremor   Labs: Results for orders placed or performed in visit on 10/08/22  POCT glycosylated hemoglobin (Hb A1C)  Result Value Ref Range   Hemoglobin A1C     HbA1c POC (<> result, manual entry) 8.5 4.0 - 5.6 %   HbA1c, POC (prediabetic range)     HbA1c, POC (controlled diabetic range)    POCT Glucose (Device for Home Use)  Result Value Ref Range   Glucose Fasting, POC     POC Glucose 76 70 - 99 mg/dl    A1c trend: 11.7% 12/2018 at diagnosis--> 11.2% 08/2019--> 12.9% 11/2019--> 9.4% 01/2020--> 10.2% 05/2020-->9.7% 07/2020--> 8.2% 09/2020--> 9% 11/09/20--> 14% 03/2021--> 10.6% 09/2021--> >14% 12/2021-->9.3% 03/2022--> 9.1% 06/2022--> 8.5% 10/2022  Assessment/Plan: Barack Nicodemus is a 21 y.o. male with T1DM on an MDI and CGM regimen.   A1c is lower than last visit and is above the ADA goal of <7.0%.  he needs less basal insulin.  He would also greatly benefit from omnipod 5.    When a patient is on insulin, intensive monitoring of blood glucose levels and continuous insulin titration is vital to avoid insulin toxicity leading to severe hypoglycemia. Severe hypoglycemia can lead to seizure or death. Hyperglycemia can also result from  inadequate insulin dosing and can lead to ketosis requiring ICU admission and intravenous insulin.   1. Type 1 diabetes with hyperglycemia - POCT Glucose and POCT HgB A1C as above -Will draw lipid panel today.  If improving, will continue lifestyle changes. -Encouraged to wear med alert ID every day -Provided with my contact information and advised to email/send mychart with questions/need for BG review -Will send message to Dr. Lovena Le to schedule pump start  2. Insulin dose change -Made the following insulin changes: Tresiba 24 units daily  Continue Novolog 120/30/6 plan   Follow-up:   Return in about 3 months (around 01/08/2023).   >30 minutes spent today reviewing the medical chart, counseling the patient/family, and documenting today's encounter.  Levon Hedger, MD  -------------------------------- 10/09/22 5:46 AM ADDENDUM: Results for orders placed or performed in visit on 10/08/22  Lipid panel  Result Value Ref Range   Cholesterol 175 (H) <170 mg/dL   HDL 41 (L) >45 mg/dL   Triglycerides 52 <90 mg/dL   LDL Cholesterol (Calc) 120 (H) <110 mg/dL (calc)   Total CHOL/HDL Ratio 4.3 <5.0 (calc)   Non-HDL Cholesterol (Calc) 134 (H) <120 mg/dL (calc)  POCT glycosylated hemoglobin (Hb A1C)  Result Value Ref Range   Hemoglobin A1C     HbA1c POC (<> result, manual entry) 8.5 4.0 - 5.6 %   HbA1c, POC (prediabetic range)     HbA1c, POC (controlled diabetic range)    POCT Glucose (Device for Home Use)  Result Value Ref Range   Glucose Fasting, POC     POC Glucose 76 70 - 99 mg/dl   Sent mychart message:  Hi Artist, Your cholesterol panel is much better.  We do not need to start cholesterol medicine at this time.  Please let me know if you have questions! Dr. Charna Archer

## 2022-10-23 ENCOUNTER — Telehealth (INDEPENDENT_AMBULATORY_CARE_PROVIDER_SITE_OTHER): Payer: Self-pay | Admitting: Pharmacist

## 2022-10-23 NOTE — Telephone Encounter (Signed)
Called patient on 10/23/2022 at 10:07 AM and left HIPAA-compliant VM with instructions to call [facility] back.  Plan to discuss scheduling pump training appts.   If patient calls back please schedule 120 min (education 120 (in person) or mychart video visit (virtual) for 120 minutes with appt notes labeled prepump.  Thank you for involving pharmacy/diabetes educator to assist in providing this patient's care.   Zachery Conch, PharmD, BCACP, CDCES, CPP

## 2022-12-11 ENCOUNTER — Encounter (INDEPENDENT_AMBULATORY_CARE_PROVIDER_SITE_OTHER): Payer: Self-pay | Admitting: Pediatrics

## 2022-12-11 DIAGNOSIS — E1065 Type 1 diabetes mellitus with hyperglycemia: Secondary | ICD-10-CM

## 2022-12-13 ENCOUNTER — Telehealth (INDEPENDENT_AMBULATORY_CARE_PROVIDER_SITE_OTHER): Payer: Self-pay

## 2022-12-13 MED ORDER — DEXCOM G7 SENSOR MISC: 6 refills | Status: AC

## 2022-12-13 NOTE — Telephone Encounter (Signed)
Received notification to start PA for Dexcom G7 from Dr Charna Archer. Initiated on covermymeds.  Key: [id number] - PA Case ID: [id number]

## 2022-12-16 NOTE — Telephone Encounter (Signed)
Fax received from [facility], stating pts Michael [name] have been previously APPROVED thru 07/11/2023

## 2022-12-16 NOTE — Telephone Encounter (Signed)
Notification on covermymeds states that: N/Aon January 12 We have assigned a new case ID to this request, and it is now pending review. You may contact the Prior Authorization Department at [phone] for further questions.

## 2022-12-19 ENCOUNTER — Other Ambulatory Visit (INDEPENDENT_AMBULATORY_CARE_PROVIDER_SITE_OTHER): Payer: Self-pay | Admitting: Pediatrics

## 2022-12-19 DIAGNOSIS — E109 Type 1 diabetes mellitus without complications: Secondary | ICD-10-CM

## 2023-01-09 ENCOUNTER — Ambulatory Visit (INDEPENDENT_AMBULATORY_CARE_PROVIDER_SITE_OTHER): Payer: Self-pay | Admitting: Pediatrics

## 2023-01-27 ENCOUNTER — Encounter (INDEPENDENT_AMBULATORY_CARE_PROVIDER_SITE_OTHER): Payer: Self-pay

## 2023-02-05 ENCOUNTER — Encounter (INDEPENDENT_AMBULATORY_CARE_PROVIDER_SITE_OTHER): Payer: Self-pay | Admitting: Pediatrics

## 2023-02-05 ENCOUNTER — Ambulatory Visit (INDEPENDENT_AMBULATORY_CARE_PROVIDER_SITE_OTHER): Payer: Medicaid Other | Admitting: Pediatrics

## 2023-02-05 VITALS — BP 126/70 | HR 68 | Ht 68.03 in | Wt 146.4 lb

## 2023-02-05 DIAGNOSIS — Z794 Long term (current) use of insulin: Secondary | ICD-10-CM

## 2023-02-05 DIAGNOSIS — E1065 Type 1 diabetes mellitus with hyperglycemia: Secondary | ICD-10-CM | POA: Diagnosis not present

## 2023-02-05 LAB — POCT GLYCOSYLATED HEMOGLOBIN (HGB A1C): HbA1c POC (<> result, manual entry): 9.2 % (ref 4.0–5.6)

## 2023-02-05 LAB — POCT GLUCOSE (DEVICE FOR HOME USE): POC Glucose: 81 mg/dl (ref 70–99)

## 2023-02-05 NOTE — Progress Notes (Signed)
Pediatric Endocrinology Consultation Follow-up Visit  Michael [name] Apr 04, 2001 [id number]  Chief Complaint: type 1 diabetes  HPI: Michael [name] is a 22 y.o. male presenting for follow-up of the above concerns.  he attended this visit alone.  31. Michael [name] was diagnosed with T1DM on 12/08/2018 (presented to [facility] with DKA initially).  At presentation, A1c elevated at 11.2%, C-peptide low at 0.7, islet cell ab negative, insulin Ab elevated at 6.3 (normal <5), GAD ab positive at 632.9 (normal <5), celiac screen negative.  He was started on an MDI regimen and discharged from the hospital on 12/11/18.  He started on a dexcom CGM in 01/2019.  2. Since last visit on 10/08/22 he has been well.   ED visits/Hospitalizations: None  Concerns:  -Having lows.  Thinks tresiba dose is too high. Also thinks he is taking too much insulin for breakfast as he is always low at that time.  Eats waffle, syrup, OJ, eggs, usually takes 8 units.   -Interested in omnipod 5  Insulin regimen:  Tresiba 24 units daily before bed (10PM).  Novolog 120/30/6 plan.   CGM download:    Interpretation: too many lows.  Hypoglycemia: can feel lows.  No glucagon needed recently. Has Baqsimi Wearing Med-alert ID currently: not currently.  Has one Injection sites: Leg, arms Annual labs due: 06/2023 Ophthalmology due: Recent visit- no retina concerns.  Due Summer 2024  ROS: All systems reviewed with pertinent positives listed below; otherwise negative. Constitutional: Weight has increased 4lb since last visit.      Past Medical History:   Past Medical History:  Diagnosis Date   Type 1 diabetes ([place])    Dx 12/2018, presented with DKA.  + Insulin Ab and GAD Ab.  Low C-peptide.  Negative celiac screen.    Meds: Outpatient Encounter Medications as of 02/05/2023  Medication Sig   ACCU-CHEK FASTCLIX LANCETS MISC Check sugar 10 x daily   acetaminophen (TYLENOL) 500 MG tablet Take 500 mg by mouth every 6 (six) hours as needed  for mild pain.   Alcohol Swabs (ALCOHOL PADS) 70 % PADS Wipe skin prior to injection   BD PEN NEEDLE NANO 2ND GEN 32G X 4 MM MISC USE WITH INSULIN PEN 6 TIMES DAILY   Continuous Blood Gluc Sensor (DEXCOM G7 SENSOR) MISC Place 1 sensor on the skin every 10 days   Glucagon (BAQSIMI TWO PACK) 3 MG/DOSE POWD Place 1 application into the nose as needed. Use as directed if unconscious, unable to take food po, or having a seizure due to hypoglycemia   glucose blood (ACCU-CHEK GUIDE) test strip Use to check BG 6 times daily   insulin aspart (NOVOLOG) 100 UNIT/ML injection Inject up to 200 units into insulin pump every 2 days. Please fill for insulin VIAL.   insulin degludec (TRESIBA FLEXTOUCH) 100 UNIT/ML FlexTouch Pen ADMINISTER UP TO 50 UNITS UNDER THE SKIN DAILY AS DIRECTED BY PRESCRIBER   Insulin Glargine (LANTUS SOLOSTAR) 100 UNIT/ML Solostar Pen Up to 50 units per day as directed by MD   Multiple Vitamins-Minerals (MULTIVITAMIN ADULT, MINERALS,) TABS Take by mouth.   NOVOLOG FLEXPEN 100 UNIT/ML FlexPen INJECT UP TO 50 UNITS DAILY PER PROVIDER INSTRUCTION   acetone, urine, test strip Check ketones per protocol (Patient not taking: Reported on 12/11/2021)   Continuous Blood Gluc Sensor (DEXCOM G6 SENSOR) MISC WEAR CONTINUOUSLY FOR 10 DAYS TABLET;HEN REMOVE AND REPLACE WITH A NEW SENSOR (Patient not taking: Reported on 10/08/2022)   Continuous Blood Gluc Transmit (DEXCOM G6 TRANSMITTER) MISC USE  WITH DEXCOM SENSOR, REUSE FOR 3 MONTHS (Patient not taking: Reported on 10/08/2022)   Insulin Disposable Pump (OMNIPOD 5 G6 INTRO, GEN 5,) KIT Inject 1 kit into the skin as directed. . Change pod every 2 days. Intro kit comes with 2 boxes of pods, PDM device, pod pals, and user manual. Please fill for Omnipod 5 Into kit NDC [phone] (Patient not taking: Reported on 03/21/2022)   No facility-administered encounter medications on file as of 02/05/2023.   Allergies: Allergies  Allergen Reactions   Penicillins  Hives and Swelling    DID THE REACTION INVOLVE: Swelling of the face/tongue/throat, SOB, or low BP? Yes Sudden or severe rash/hives, skin peeling, or the inside of the mouth or nose? Yes Did it require medical treatment? Yes When did it last happen?       If all above answers are "NO", may proceed with cephalosporin use.    Surgical History: History reviewed. No pertinent surgical history.   Family History:  Family History  Problem Relation Age of Onset   Diabetes Paternal Aunt    Diabetes Paternal Uncle    Diabetes Paternal Grandmother    Social History: Social History   Social History Narrative   Attending [employer], playing basketball there.    Physical Exam:  Vitals:   02/05/23 0946  BP: 126/70  Pulse: 68  Weight: 146 lb 6.4 oz (66.4 kg)  Height: 5' 8.03" (1.728 m)    BP 126/70 (BP Location: Left Arm, Patient Position: Sitting, Cuff Size: Large)   Pulse 68   Ht 5' 8.03" (1.728 m)   Wt 146 lb 6.4 oz (66.4 kg)   BMI 22.24 kg/m  Body mass index: body mass index is 22.24 kg/m. Blood pressure %iles are not available for patients who are 18 years or older.  Wt Readings from Last 3 Encounters:  02/05/23 146 lb 6.4 oz (66.4 kg) (35 %, Z= -0.39)*  10/08/22 142 lb (64.4 kg) (29 %, Z= -0.55)*  06/19/22 142 lb (64.4 kg) (30 %, Z= -0.51)*   * Growth percentiles are based on CDC (Boys, 2-20 Years) data.   Ht Readings from Last 3 Encounters:  02/05/23 5' 8.03" (1.728 m) (29 %, Z= -0.57)*  12/11/21 5' 7.72" (1.72 m) (26 %, Z= -0.63)*  09/11/21 5' 7.56" (1.716 m) (25 %, Z= -0.68)*   * Growth percentiles are based on CDC (Boys, 2-20 Years) data.   General: Well developed, well nourished male in no acute distress.  Appears stated age Head: Normocephalic, atraumatic.   Eyes:  Pupils equal and round. EOMI.   Sclera white.  No eye drainage.   Ears/Nose/Mouth/Throat: Nares patent, no nasal drainage.  Moist mucous membranes, normal dentition Neck:  supple, no cervical lymphadenopathy, no thyromegaly Cardiovascular: regular rate, normal S1/S2, no murmurs Respiratory: No increased work of breathing.  Lungs clear to auscultation bilaterally.  No wheezes. Abdomen: soft, nontender, nondistended.  Extremities: warm, well perfused, cap refill < 2 sec.   Musculoskeletal: Normal muscle mass.  Normal strength Skin: warm, dry.  No rash or lesions. Neurologic: alert and oriented, normal speech, no tremor   Labs: Results for orders placed or performed in visit on 02/05/23  POCT Glucose (Device for Home Use)  Result Value Ref Range   Glucose Fasting, POC     POC Glucose 81 70 - 99 mg/dl  POCT glycosylated hemoglobin (Hb A1C)  Result Value Ref Range   Hemoglobin A1C     HbA1c POC (<> result, manual entry) 9.2  4.0 - 5.6 %   HbA1c, POC (prediabetic range)     HbA1c, POC (controlled diabetic range)      A1c trend: 11.7% 12/2018 at diagnosis--> 11.2% 08/2019--> 12.9% 11/2019--> 9.4% 01/2020--> 10.2% 05/2020-->9.7% 07/2020--> 8.2% 09/2020--> 9% 11/09/20--> 14% 03/2021--> 10.6% 09/2021--> >14% 12/2021-->9.3% 03/2022--> 9.1% 06/2022--> 8.5% 10/2022--> 9.2% 01/2023  Assessment/Plan: Alcario Andress is a 22 y.o. male with T1DM on an MDI and CGM regimen.   A1c is higher than last visit. The ADA goal for A1c is <7.0%.  Dexcom tracing shows he is not meeting goal of TIR >70%.  He is having many lows and needs a lower dose of tresiba.  He also needs less insulin for breakfast.  He may benefit from an omnipod 5.   When a patient is on insulin, intensive monitoring of blood glucose levels and continuous insulin titration is vital to avoid insulin toxicity leading to severe hypoglycemia. Severe hypoglycemia can lead to seizure or death. Hyperglycemia can also result from inadequate insulin dosing and can lead to ketosis requiring ICU admission and intravenous insulin.   1. Type 1 diabetes with hyperglycemia - POCT Glucose and POCT HgB A1C as above -Encouraged to wear  med alert ID every day -Encouraged to rotate injection sites -Provided with my contact information and advised to email/send mychart with questions/need for BG review -CGM download reviewed extensively (see interpretation above) -Will send message to Drexel Iha about scheduling pump start.  2. Insulin dose change -Made the following insulin changes: Reduce tresiba to 23 units daily Reduce humalog to 7 units with breakfast.  If still having lows, can cut that dose to 6 units.  Follow-up:   Return in about 3 months (around 05/08/2023).   >30 minutes spent today reviewing the medical chart, counseling the patient/family, and documenting today's encounter.   Levon Hedger, MD

## 2023-02-05 NOTE — Patient Instructions (Addendum)
It was a pleasure to see you in clinic today.   Feel free to contact our office during normal business hours at [phone] with questions or concerns. If you have an emergency after normal business hours, please call the above number to reach our answering service who will contact the on-call pediatric endocrinologist.  If you choose to communicate with [place] via [place], please do not send urgent messages as this inbox is NOT monitored on nights or weekends.  Urgent concerns should be discussed with the on-call pediatric endocrinologist.  -Always have fast sugar with you in case of low blood sugar (glucose tabs, regular juice or soda, candy) -Always wear your ID that states you have diabetes -Always bring your meter/continuous glucose monitor to your visit  Reduce tresiba to 23 units.   Reduce breakfast dose to 7 units   Hypoglycemia  Shaking or trembling. Sweating and chills. Dizziness or lightheadedness. Faster heart rate. Headaches. Hunger. Nausea. Nervousness or irritability. Pale skin. Restless sleep. Weakness. Blurry vision. Confusion or trouble concentrating. Sleepiness. Slurred speech. Tingling or numbness in the face or mouth.  How do I treat an episode of hypoglycemia? The American Diabetes Association recommends the "15-15 rule" for an episode of hypoglycemia: Eat or drink 15 grams of fast-acting carbs (4oz regular soda or juice, 1 pkg fruit snacks, 4 glucose tabs) to raise your blood sugar. After 15 minutes, check your blood sugar. If it's still below 80 mg/dL, have another 15 grams of fast-acting carbs. Repeat until your blood sugar is at least 80 mg/dL.  Hyperglycemia  Frequent urination Increased thirst Blurred vision Fatigue Headache          Diabetic Ketoacidosis (DKA)  If hyperglycemia goes untreated, it can cause toxic acids (ketones) to build up in your blood and urine (ketoacidosis). Signs and symptoms include: Fruity-smelling breath Nausea  and vomiting Shortness of breath Dry mouth Weakness Confusion Coma Abdominal pain  Sick day/Ketones Protocol  Check blood glucose every 3 hours  Give rapid acting insulin correction dose           every 3 hours until ketones are negative Check urine ketones every 2 hours (until ketones are negative)  Drink plenty of fluids (water, Pedialyte) every hour Notify clinic of sickness/ketones  If you develop signs of DKA (especially vomiting or abdominal pain and inability to drink), go to [facility] room immediately.   Hemoglobin A1c levels     Reduce

## 2023-02-10 ENCOUNTER — Encounter (INDEPENDENT_AMBULATORY_CARE_PROVIDER_SITE_OTHER): Payer: Self-pay | Admitting: Pharmacist

## 2023-02-14 ENCOUNTER — Telehealth (INDEPENDENT_AMBULATORY_CARE_PROVIDER_SITE_OTHER): Payer: Self-pay | Admitting: Pharmacist

## 2023-02-14 NOTE — Telephone Encounter (Signed)
Called patient on 02/14/2023 at 9:56 AM and left HIPAA-compliant VM with instructions to call [facility] back.  Plan to discuss rescheduling pump appointments.   Thank you for involving pharmacy/diabetes educator to assist in providing this patient's care.   Drexel Iha, PharmD, BCACP, [place], CPP

## 2023-02-20 NOTE — Progress Notes (Deleted)
This is a Pediatric Specialist E-Visit (My Chart Video Visit) follow up consult provided via WebEx [name] consented to an E-Visit consult today Location of patient: [place] are at home  Location of provider: Drexel Iha, PharmD, BCACP, CDCES, CPP is working remotely  S:     No chief complaint on file.   Endocrinology provider: Dr. Charna Archer  Patient has decided to initiate process to start an insulin pump. PMH significant for T1DM (dx 12/08/2018; A1c 11.2%, insulin ab positive (6.3 uU/mL), GAD65 positive (632.9 U/mL), pancreatic islet clel ab negative, c-peptide low (0.7 ng/mL)). He currently wears Dexcom G6 CGM.  I connected with [name] on *** by video and verified that I am speaking with the correct person using two identifiers. ***  Insurance Coverage:  Managed Medicaid Melinda Crutch)  Preferred [facility], [place] [address] Phone: [phone]  Fax: [phone] DEA #: [id number]   Diabetes Medication Regimen  -Basal insulin: Tyler Aas 23 units daily -Bolus insulin: Novolog --ICR: 6 --ISF: 30 --Target BG: 120 mg/dL (day) and 200 mg/dL (bed)   Pre-pump Topics Insulin Pump Basics Sick Day Management Pump Failure Travel  Pump Start Instructions   Prepump Survey Responses Long acting insulin, dose, time administered: *** Rapid acting insulin: *** Breakfast time: *** Lunch time: *** Dinner time: *** I wake up in the morning at: *** AM I go to bed at: *** I feel I need an insulin dose adjustment: ***  Labs:    There were no vitals filed for this visit.  HbA1c Lab Results  Component Value Date   HGBA1C 9.2 02/05/2023   HGBA1C 8.5 10/08/2022   HGBA1C 9.1 (A) 06/19/2022    Pancreatic Islet Cell Autoantibodies Lab Results  Component Value Date   ISLETAB Negative 12/08/2018    Insulin Autoantibodies Lab Results  Component  Value Date   INSULINAB 6.3 (H) 12/08/2018    Glutamic Acid Decarboxylase Autoantibodies Lab Results  Component Value Date   GLUTAMICACAB 632.9 (H) 12/08/2018    ZnT8 Autoantibodies No results found for: "ZNT8AB"  IA-2 Autoantibodies No results found for: "LABIA2"  C-Peptide Lab Results  Component Value Date   CPEPTIDE 0.7 (L) 12/08/2018    Microalbumin Lab Results  Component Value Date   MICRALBCREAT 4 06/19/2022    Lipids    Component Value Date/Time   CHOL 175 (H) 10/08/2022 0954   TRIG 52 10/08/2022 0954   HDL 41 (L) 10/08/2022 0954   CHOLHDL 4.3 10/08/2022 0954   LDLCALC 120 (H) 10/08/2022 0954    Assessment: Education - Thoroughly discussed all pre-pump topics (insulin pump basics, sick day management, pump failure, travel, and pump start instructions). Patient/family had concerns related to ***; therefore, discussed topics in depth until family felt confident with understanding of topics.   Pump Start Instructions - Sent prescription for *** vial to patient's preferred pharmacy. The patient/family understand that the family should bring all insulin pump supplies as well as insulin vial to pump start appointment. Advised patient to *** long acting insulin on ***.   Plan: Pre-Pump Education Discussed all pre-pump topics (insulin pump basics, sick day management, pump failure, travel, and pump start instructions) until family felt confident in their understanding of each topic.  Pump Start Appointment Sent prescription for *** vial to patient's preferred pharmacy.  The patient/family understand that the family should bring all insulin pump supplies as well as  insulin vial to pump start appointment.  Advised patient to *** long acting insulin on ***.  Follow Up: ***  This appointment required *** minutes of patient care (this includes precharting, chart review, review of results, virtual care, etc.).  Thank you for involving clinical pharmacist/diabetes  educator to assist in providing this patient's care.  Drexel Iha, PharmD, BCACP, [place], CPP

## 2023-02-21 ENCOUNTER — Telehealth (INDEPENDENT_AMBULATORY_CARE_PROVIDER_SITE_OTHER): Payer: Self-pay | Admitting: Pharmacist

## 2023-02-21 NOTE — Telephone Encounter (Signed)
Called patient on 02/21/2023 at 8:46 AM and left HIPAA-compliant voicemail with instructions to call [facility] back.  Plan to discuss prepump appt. The prepump appointment was originally scheduled for 3/15 9:30 am and Omnipod 5 pump start scheduled for today 3/22 8:30 am. However, since he did not attend prepump appointment I was planning on reviewing prepump education materials and then rescheduling Omnipod 5 pump start appt.  Will await for patient to call to reschedule appt at this time.   Thank you for involving pharmacy/diabetes educator to assist in providing this patient's care.   Drexel Iha, PharmD, BCACP, [place], CPP

## 2023-03-17 ENCOUNTER — Encounter: Payer: Self-pay | Admitting: *Deleted

## 2023-04-18 ENCOUNTER — Telehealth: Payer: Medicaid Other

## 2023-04-25 ENCOUNTER — Encounter (INDEPENDENT_AMBULATORY_CARE_PROVIDER_SITE_OTHER): Payer: Self-pay | Admitting: Pediatrics

## 2023-05-08 ENCOUNTER — Ambulatory Visit (INDEPENDENT_AMBULATORY_CARE_PROVIDER_SITE_OTHER): Payer: Medicaid Other | Admitting: Pediatrics

## 2023-06-03 ENCOUNTER — Other Ambulatory Visit (INDEPENDENT_AMBULATORY_CARE_PROVIDER_SITE_OTHER): Payer: Self-pay | Admitting: Pediatrics

## 2023-06-03 DIAGNOSIS — E109 Type 1 diabetes mellitus without complications: Secondary | ICD-10-CM

## 2023-06-06 ENCOUNTER — Encounter (INDEPENDENT_AMBULATORY_CARE_PROVIDER_SITE_OTHER): Payer: Self-pay

## 2023-06-18 ENCOUNTER — Ambulatory Visit (INDEPENDENT_AMBULATORY_CARE_PROVIDER_SITE_OTHER): Payer: Medicaid Other | Admitting: Pediatrics

## 2023-06-18 ENCOUNTER — Encounter (INDEPENDENT_AMBULATORY_CARE_PROVIDER_SITE_OTHER): Payer: Self-pay | Admitting: Pediatrics

## 2023-06-18 VITALS — BP 120/86 | HR 86 | Ht 68.0 in | Wt 140.0 lb

## 2023-06-18 DIAGNOSIS — E1065 Type 1 diabetes mellitus with hyperglycemia: Secondary | ICD-10-CM | POA: Diagnosis not present

## 2023-06-18 LAB — POCT GLYCOSYLATED HEMOGLOBIN (HGB A1C): Hemoglobin A1C: 9.5 % — AB (ref 4.0–5.6)

## 2023-06-18 LAB — POCT GLUCOSE (DEVICE FOR HOME USE): Glucose Fasting, POC: 210 mg/dL — AB (ref 70–99)

## 2023-06-18 NOTE — Progress Notes (Addendum)
Pediatric Endocrinology Consultation Follow-up Visit  [name] 2001/05/13 [id number]  Chief Complaint: type 1 diabetes  HPI: Michael [name] is a 22 y.o. male presenting for follow-up of the above concerns.  he attended this visit alone.  1. Michael [name] was diagnosed with T1DM on 12/08/2018 (presented to [facility] with DKA initially).  At presentation, A1c elevated at 11.2%, C-peptide low at 0.7, islet cell ab negative, insulin Ab elevated at 6.3 (normal <5), GAD ab positive at 632.9 (normal <5), celiac screen negative.  He was started on an MDI regimen and discharged from the hospital on 12/11/18.  He started on a dexcom CGM in 01/2019.  2. Since last visit on 06/18/23, he has been well.   ED visits/Hospitalizations: None  Concerns:  -Expecting a son in Nov 2024. -Having lows and highs.    Insulin regimen:  Tresiba 24 units daily before bed  Novolog 120/30/6 plan.   CGM download:    Interpretation: Having many lows.  Needs less tresiba and less novolog  Hypoglycemia: can feel lows.  No glucagon needed recently. Has Baqsimi Wearing Med-alert ID currently: not currently.  Reminded to wear something at all times Injection sites: Leg, arms Annual labs due: 06/2023-Will draw today Ophthalmology due: Recent visit- no retina concerns.  Due Summer 2024.   ROS: All systems reviewed with pertinent positives listed below; otherwise negative. Constitutional: Weight has Decreased 6lb since last visit.     Past Medical History:   Past Medical History:  Diagnosis Date   Type 1 diabetes (HCC)    Dx 12/2018, presented with DKA.  + Insulin Ab and GAD Ab.  Low C-peptide.  Negative celiac screen.    Meds: Outpatient Encounter Medications as of 06/18/2023  Medication Sig   Continuous Blood Gluc Sensor (DEXCOM G6 SENSOR) MISC WEAR CONTINUOUSLY FOR 10 DAYS TABLET;HEN REMOVE AND REPLACE WITH A NEW SENSOR   Continuous Blood Gluc Sensor (DEXCOM G7 SENSOR) MISC Place 1 sensor on the skin every 10 days    Continuous Blood Gluc Transmit (DEXCOM G6 TRANSMITTER) MISC USE WITH DEXCOM SENSOR, REUSE FOR 3 MONTHS   insulin degludec (TRESIBA FLEXTOUCH) 100 UNIT/ML FlexTouch Pen ADMINISTER UP TO 50 UNITS UNDER THE SKIN DAILY AS DIRECTED BY PRESCRIBER   NOVOLOG FLEXPEN 100 UNIT/ML FlexPen INJECT UP TO 50 UNITS DAILY PER PROVIDER INSTRUCTION   ACCU-CHEK FASTCLIX LANCETS MISC Check sugar 10 x daily (Patient not taking: Reported on 06/18/2023)   acetaminophen (TYLENOL) 500 MG tablet Take 500 mg by mouth every 6 (six) hours as needed for mild pain. (Patient not taking: Reported on 06/18/2023)   acetone, urine, test strip Check ketones per protocol (Patient not taking: Reported on 12/11/2021)   Alcohol Swabs (ALCOHOL PADS) 70 % PADS Wipe skin prior to injection (Patient not taking: Reported on 06/18/2023)   BD PEN NEEDLE NANO 2ND GEN 32G X 4 MM MISC USE WITH INSULIN PEN 6 TIMES DAILY (Patient not taking: Reported on 06/18/2023)   Glucagon (BAQSIMI TWO PACK) 3 MG/DOSE POWD Place 1 application into the nose as needed. Use as directed if unconscious, unable to take food po, or having a seizure due to hypoglycemia (Patient not taking: Reported on 06/18/2023)   glucose blood (ACCU-CHEK GUIDE) test strip Use to check BG 6 times daily (Patient not taking: Reported on 06/18/2023)   insulin aspart (NOVOLOG) 100 UNIT/ML injection Inject up to 200 units into insulin pump every 2 days. Please fill for insulin VIAL.   Insulin Disposable Pump (OMNIPOD 5 G6 INTRO, GEN 5,) KIT  Inject 1 kit into the skin as directed. . Change pod every 2 days. Intro kit comes with 2 boxes of pods, PDM device, pod pals, and user manual. Please fill for Omnipod 5 Into kit NDC [phone] (Patient not taking: Reported on 03/21/2022)   Insulin Glargine (LANTUS SOLOSTAR) 100 UNIT/ML Solostar Pen Up to 50 units per day as directed by MD (Patient not taking: Reported on 06/18/2023)   Multiple Vitamins-Minerals (MULTIVITAMIN ADULT, MINERALS,) TABS Take by mouth.  (Patient not taking: Reported on 06/18/2023)   No facility-administered encounter medications on file as of 06/18/2023.   Allergies: Allergies  Allergen Reactions   Penicillins Hives and Swelling    DID THE REACTION INVOLVE: Swelling of the face/tongue/throat, SOB, or low BP? Yes Sudden or severe rash/hives, skin peeling, or the inside of the mouth or nose? Yes Did it require medical treatment? Yes When did it last happen?       If all above answers are "NO", may proceed with cephalosporin use.    Surgical History: History reviewed. No pertinent surgical history.   Family History:  Family History  Problem Relation Age of Onset   Diabetes Paternal Aunt    Diabetes Paternal Uncle    Diabetes Paternal Grandmother    Social History: Social History   Social History Narrative   [name], playing basketball there.    Physical Exam:  Vitals:   06/18/23 0908  BP: 120/86  Pulse: 86  Weight: 140 lb (63.5 kg)  Height: 5\' 8"  (1.727 m)   BP 120/86 (BP Location: Left Arm, Patient Position: Sitting, Cuff Size: Normal)   Pulse 86   Ht 5\' 8"  (1.727 m)   Wt 140 lb (63.5 kg)   BMI 21.29 kg/m  Body mass index: body mass index is 21.29 kg/m. Growth %ile SmartLinks can only be used for patients less than 68 years old.  Wt Readings from Last 3 Encounters:  06/18/23 140 lb (63.5 kg)  02/05/23 146 lb 6.4 oz (66.4 kg) (35%, Z= -0.39)*  10/08/22 142 lb (64.4 kg) (29%, Z= -0.55)*   * Growth percentiles are based on CDC (Boys, 2-20 Years) data.   Ht Readings from Last 3 Encounters:  06/18/23 5\' 8"  (1.727 m)  02/05/23 5' 8.03" (1.728 m) (29%, Z= -0.57)*  12/11/21 5' 7.72" (1.72 m) (26%, Z= -0.63)*   * Growth percentiles are based on CDC (Boys, 2-20 Years) data.   General: Well developed, well nourished male in no acute distress.  Appears stated age Head: Normocephalic, atraumatic.   Eyes:  Pupils equal and round. EOMI.   Sclera white.  No eye drainage.    Ears/Nose/Mouth/Throat: Nares patent, no nasal drainage.  Moist mucous membranes, normal dentition Neck: supple, no cervical lymphadenopathy, no thyromegaly Cardiovascular: regular rate, normal S1/S2, no murmurs Respiratory: No increased work of breathing.  Lungs clear to auscultation bilaterally.  No wheezes. Abdomen: soft, nontender, nondistended.  Extremities: warm, well perfused, cap refill < 2 sec.   Musculoskeletal: Normal muscle mass.  Normal strength Skin: warm, dry.  No rash or lesions.  Skin normal at injection sites.  Neurologic: alert and oriented, normal speech, no tremor   Labs: Results for orders placed or performed in visit on 06/18/23  POCT Glucose (Device for Home Use)  Result Value Ref Range   Glucose Fasting, POC 210 (A) 70 - 99 mg/dL   POC Glucose    POCT glycosylated hemoglobin (Hb A1C)  Result Value Ref Range   Hemoglobin A1C 9.5 (A)  4.0 - 5.6 %   HbA1c POC (<> result, manual entry)     HbA1c, POC (prediabetic range)     HbA1c, POC (controlled diabetic range)      A1c trend: 11.7% 12/2018 at diagnosis--> 11.2% 08/2019--> 12.9% 11/2019--> 9.4% 01/2020--> 10.2% 05/2020-->9.7% 07/2020--> 8.2% 09/2020--> 9% 11/09/20--> 14% 03/2021--> 10.6% 09/2021--> >14% 12/2021-->9.3% 03/2022--> 9.1% 06/2022--> 8.5% 10/2022--> 9.2% 01/2023--> 9.5% 06/2023  Assessment/Plan: Tyerell [place] is a 22 y.o. male with T1DM on an MDI and CGM regimen.   A1c is higher than last visit. The ADA goal for A1c is <7.0%.  Dexcom tracing shows he is not meeting goal of TIR >70%.  he is having too many lows; he needs less tresiba and less novolog with meals/correction  When a patient is on insulin, intensive monitoring of blood glucose levels and continuous insulin titration is vital to avoid insulin toxicity leading to severe hypoglycemia. Severe hypoglycemia can lead to seizure or death. Hyperglycemia can also result from inadequate insulin dosing and can lead to ketosis requiring ICU admission and  intravenous insulin.   1. Type 1 diabetes with hyperglycemia -POC glucose and A1c as above, -CGM download reviewed extensively (see interpretation above), --Encouraged to wear med alert ID every day, -Provided with my contact information and advised to email/send mychart with questions/need for BG review, and -Will draw annual diabetes labs today (lipid panel, TSH, FT4, urine microalbumin to creatinine ratio)  2. Insulin dose change -Made the following insulin changes: Reduce tresiba dose to 23 units daily Reduce novolog correction and carb ratios  DIABETES PLAN  Rapid Acting Insulin (Novolog/FiASP (Aspart) and Humalog/Lyumjev (Lispro))  **Given for Food/Carbohydrates and High Sugar/Glucose**   DAYTIME (breakfast, lunch, dinner)  Target Blood Glucose 125mg /dL Insulin Sensitivity Factor 50 Insulin to Carb Ratio 1 unit for 8 grams   Correction DOSE Food DOSE  (Glucose -Target)/Insulin Sensitivity Factor  Glucose (mg/dL) Units of Rapid Acting Insulin  Less than 125 0  126-175 1  176-225 2  226-275 3  276-325 4  326-375 5  376-425 6  426-475 7  476-525 8  526-575 9  576 or more 10    Number of carbohydrates divided by carb ratio  Number of Carbs Units of Rapid Acting Insulin  0-7 0  8-15 1  16-23 2  24-31 3  32-39 4  40-47 5  48-55 6  56-63 7  64-71 8  72-79 9  80-87 10  88-95 11  96-103 12  104-111 13  112-119 14  120-127 15  128-135 16   136-143 17  144-151 18  152-159 19  160+ (# carbs divided by 8)                  **Correction Dose + Food Dose = Number of units of rapid acting insulin **  Correction for High Sugar/Glucose Food/Carbohydrate  Measure Blood Glucose BEFORE you eat. (Fingerstick with Glucose Meter or check the reading on your Continuous Glucose Meter).  Use the table above or calculate the dose using the formula.  Add this dose to the Food/Carbohydrate dose if eating a meal.  Correction should not be given sooner than every 3  hours since the last dose of rapid acting insulin. 1. Count the number of carbohydrates you will be eating.  2. Use the table above or calculate the dose using the formula.  3. Add this dose to the Correction dose if glucose is above target.         BEDTIME Target  Blood Glucose 200 mg/dL Insulin Sensitivity Factor 50 Insulin to Carb Ratio  1 unit for 8 grams   Wait at least 3 hours after taking dinner dose of insulin BEFORE checking bedtime glucose.   Blood Sugar Less Than  125mg /dL? Blood Sugar Between 126 - 200mg /dL? Blood Sugar Greater Than 200mg /dL?  You MUST EAT 15 carbs  1. Carb snack not needed  Carb snack not needed    2. Additional, Optional Carb Snack?  If you want more carbs, you CAN eat them now! Make sure to subtract MUST EAT carbs from total carbs then look at chart below to determine food dose. 2. Optional Carb Snack?   You CAN eat this! Make sure to add up total carbs then look at chart below to determine food dose. 2. Optional Carb Snack?   You CAN eat this! Make sure to add up total carbs then look at chart below to determine food dose.  3. Correction Dose of Insulin?  NO  3. Correction Dose of Insulin?  NO 3. Correction Dose of Insulin?  YES; please look at correction dose chart to determine correction dose.   Glucose (mg/dL) Units of Rapid Acting Insulin  Less than 200 0  201-250 1  251-300 2  301-350 3  351-400 4  401-450 5  451-500 6  501-550 7  551 or more 8     Number of Carbs Units of Rapid Acting Insulin  0-7 0  8-15 1  16-23 2  24-31 3  32-39 4  40-47 5  48-55 6  56-63 7  64-71 8  72-79 9  80-87 10  88-95 11  96-103 12  104-111 13  112-119 14  120-127 15  128-135 16   136-143 17  144-151 18  152-159 19  160+ (# carbs divided by 8)           Long Acting Insulin (Glargine (Basaglar/Lantus/Semglee)/Levemir/Tresiba)  **Remember long acting insulin must be given EVERY DAY, and NEVER skip this dose**                                     Give Tresiba 23 units at bedtime    If you have any questions/concerns PLEASE call [phone] to speak to the on-call  Pediatric Endocrinology provider at [facility].  [name], MD     3. Counseling for insulin pump -Reviewed pumps (including tubed and tubeless variety), basal/bolus settings, increased risk of DKA since only short acting insulin, need for close monitoring and troubleshooting. Reviewed Mobi and omnipod; he will let me know if he is interested in pursuing pump therapy before next visit.    -Will transition to adult endocrine after next visit with me.  Follow-up:   Return in about 3 months (around 09/18/2023).   >40 minutes spent today reviewing the medical chart, counseling the patient/family, and documenting today's encounter.  [name], MD  -------------------------------- 06/19/23 6:11 AM ADDENDUM:  Results for orders placed or performed in visit on 06/18/23  Lipid panel  Result Value Ref Range   Cholesterol 188 <200 mg/dL   HDL 47 > OR = 40 mg/dL   Triglycerides 55 <098 mg/dL   LDL Cholesterol (Calc) 126 (H) mg/dL (calc)   Total CHOL/HDL Ratio 4.0 <5.0 (calc)   Non-HDL Cholesterol (Calc) 141 (H) <130 mg/dL (calc)  Microalbumin / creatinine urine ratio  Result Value Ref Range  Creatinine, Urine 246 20 - 320 mg/dL   Microalb, Ur 0.8 mg/dL   Microalb Creat Ratio 3 <30 mg/g creat  T4, free  Result Value Ref Range   Free T4 0.9 0.8 - 1.4 ng/dL  TSH  Result Value Ref Range   TSH 1.98 0.40 - 4.50 mIU/L  POCT Glucose (Device for Home Use)  Result Value Ref Range   Glucose Fasting, POC 210 (A) 70 - 99 mg/dL   POC Glucose    POCT glycosylated hemoglobin (Hb A1C)  Result Value Ref Range   Hemoglobin A1C 9.5 (A) 4.0 - 5.6 %   HbA1c POC (<> result, manual entry)     HbA1c, POC (prediabetic range)     HbA1c, POC (controlled diabetic range)       Mychart message sent to the family as  follows:  Hi Rafi, Your labs are fine.  We will plan to repeat them again in 1 year.   Please let me know if you have questions! Dr. Larinda Buttery

## 2023-06-18 NOTE — Patient Instructions (Signed)
It was a pleasure to see you in clinic today.   Feel free to contact our office during normal business hours at [phone] with questions or concerns. If you have an emergency after normal business hours, please call the above number to reach our answering service who will contact the on-call pediatric endocrinologist.  If you choose to communicate with us via MyChart, please do not send urgent messages as this inbox is NOT monitored on nights or weekends.  Urgent concerns should be discussed with the on-call pediatric endocrinologist.  -Always have fast sugar with you in case of low blood sugar (glucose tabs, regular juice or soda, candy) -Always wear your ID that states you have diabetes -Always bring your meter/continuous glucose monitor to your visit  Hypoglycemia  Shaking or trembling. Sweating and chills. Dizziness or lightheadedness. Faster heart rate. Headaches. Hunger. Nausea. Nervousness or irritability. Pale skin. Restless sleep. Weakness. Blurry vision. Confusion or trouble concentrating. Sleepiness. Slurred speech. Tingling or numbness in the face or mouth.  How do I treat an episode of hypoglycemia? The American Diabetes Association recommends the "15-15 rule" for an episode of hypoglycemia: Eat or drink 15 grams of fast-acting carbs (4oz regular soda or juice, 1 pkg fruit snacks, 4 glucose tabs) to raise your blood sugar. After 15 minutes, check your blood sugar. If it's still below 80 mg/dL, have another 15 grams of fast-acting carbs. Repeat until your blood sugar is at least 80 mg/dL.  Hyperglycemia  Frequent urination Increased thirst Blurred vision Fatigue Headache          Diabetic Ketoacidosis (DKA)  If hyperglycemia goes untreated, it can cause toxic acids (ketones) to build up in your blood and urine (ketoacidosis). Signs and symptoms include: Fruity-smelling breath Nausea and vomiting Shortness of breath Dry  mouth Weakness Confusion Coma Abdominal pain  Sick day/Ketones Protocol  Check blood glucose every 3 hours  Give rapid acting insulin correction dose           every 3 hours until ketones are negative Check urine ketones every 2 hours (until ketones are negative)  Drink plenty of fluids (water, Pedialyte) every hour Notify clinic of sickness/ketones  If you develop signs of DKA (especially vomiting or abdominal pain and inability to drink), go to [facility] immediately.   Hemoglobin A1c levels      

## 2023-06-19 ENCOUNTER — Encounter (INDEPENDENT_AMBULATORY_CARE_PROVIDER_SITE_OTHER): Payer: Self-pay

## 2023-06-19 LAB — T4, FREE: Free T4: 0.9 ng/dL (ref 0.8–1.4)

## 2023-06-19 LAB — LIPID PANEL
Cholesterol: 188 mg/dL (ref <200)
HDL: 47 mg/dL (ref >=40)
LDL Cholesterol (Calc): 126 mg/dL (calc) — ABNORMAL HIGH
Non-HDL Cholesterol (Calc): 141 mg/dL (calc) — ABNORMAL HIGH (ref <130)
Total CHOL/HDL Ratio: 4 (calc) (ref <5.0)
Triglycerides: 55 mg/dL (ref <150)

## 2023-06-19 LAB — MICROALBUMIN / CREATININE URINE RATIO
Creatinine, Urine: 246 mg/dL (ref 20–320)
Microalb Creat Ratio: 3 mg/g creat (ref <30)
Microalb, Ur: 0.8 mg/dL

## 2023-06-19 LAB — TSH: TSH: 1.98 mIU/L (ref 0.40–4.50)

## 2023-06-20 ENCOUNTER — Other Ambulatory Visit (INDEPENDENT_AMBULATORY_CARE_PROVIDER_SITE_OTHER): Payer: Self-pay | Admitting: Pediatrics

## 2023-06-20 DIAGNOSIS — E1010 Type 1 diabetes mellitus with ketoacidosis without coma: Secondary | ICD-10-CM

## 2023-07-01 ENCOUNTER — Other Ambulatory Visit (INDEPENDENT_AMBULATORY_CARE_PROVIDER_SITE_OTHER): Payer: Self-pay | Admitting: Pediatrics

## 2023-07-01 DIAGNOSIS — E1010 Type 1 diabetes mellitus with ketoacidosis without coma: Secondary | ICD-10-CM

## 2023-07-24 ENCOUNTER — Telehealth (INDEPENDENT_AMBULATORY_CARE_PROVIDER_SITE_OTHER): Payer: Self-pay

## 2023-07-24 NOTE — Telephone Encounter (Signed)
Received fax from covermymeds prior authorization is expiring.  Complete prior authorization   

## 2023-09-05 ENCOUNTER — Encounter (INDEPENDENT_AMBULATORY_CARE_PROVIDER_SITE_OTHER): Payer: Self-pay | Admitting: Pediatrics

## 2023-10-25 ENCOUNTER — Other Ambulatory Visit (INDEPENDENT_AMBULATORY_CARE_PROVIDER_SITE_OTHER): Payer: Self-pay | Admitting: Pediatrics

## 2023-10-25 DIAGNOSIS — E1010 Type 1 diabetes mellitus with ketoacidosis without coma: Secondary | ICD-10-CM

## 2023-10-29 ENCOUNTER — Encounter (INDEPENDENT_AMBULATORY_CARE_PROVIDER_SITE_OTHER): Payer: Self-pay

## 2023-10-29 ENCOUNTER — Other Ambulatory Visit (INDEPENDENT_AMBULATORY_CARE_PROVIDER_SITE_OTHER): Payer: Self-pay

## 2023-11-03 ENCOUNTER — Other Ambulatory Visit (INDEPENDENT_AMBULATORY_CARE_PROVIDER_SITE_OTHER): Payer: Self-pay

## 2023-11-03 DIAGNOSIS — E1010 Type 1 diabetes mellitus with ketoacidosis without coma: Secondary | ICD-10-CM

## 2023-11-03 MED ORDER — DEXCOM G6 SENSOR MISC
5 refills | Status: DC
Start: 2023-11-03 — End: 2024-01-15

## 2023-11-07 ENCOUNTER — Telehealth (INDEPENDENT_AMBULATORY_CARE_PROVIDER_SITE_OTHER): Payer: Self-pay

## 2023-11-07 DIAGNOSIS — E1010 Type 1 diabetes mellitus with ketoacidosis without coma: Secondary | ICD-10-CM

## 2023-11-07 NOTE — Telephone Encounter (Signed)
Received fax from pharmacy/covermymeds to complete prior authorization initiated on covermymeds, completed prior authorization  Pharmacy would like notification of determination [facility] P: [phone] F: [phone]

## 2023-11-10 NOTE — Telephone Encounter (Signed)
  Denied due to not having a face to face in the last 3 months.

## 2023-11-12 ENCOUNTER — Encounter (INDEPENDENT_AMBULATORY_CARE_PROVIDER_SITE_OTHER): Payer: Self-pay

## 2023-12-17 ENCOUNTER — Encounter (INDEPENDENT_AMBULATORY_CARE_PROVIDER_SITE_OTHER): Payer: Self-pay

## 2023-12-22 ENCOUNTER — Telehealth: Payer: 59 | Admitting: Physician Assistant

## 2023-12-22 DIAGNOSIS — R112 Nausea with vomiting, unspecified: Secondary | ICD-10-CM | POA: Diagnosis not present

## 2023-12-22 NOTE — Patient Instructions (Signed)
Ferd Hibbs, thank you for joining Margaretann Loveless, PA-C for today's virtual visit.  While this provider is not your primary care provider (PCP), if your PCP is located in our provider database this encounter information will be shared with them immediately following your visit.   A [facility] MyChart account gives you access to today's visit and all your visits, tests, and labs performed at [facility] " click here if you don't have a [facility] MyChart account or go to [email or web address]  Consent: (Patient) Michael [name] provided verbal consent for this virtual visit at the beginning of the encounter.  Current Medications:  Current Outpatient Medications:    ACCU-CHEK FASTCLIX LANCETS MISC, Check sugar 10 x daily (Patient not taking: Reported on 06/18/2023), Disp: 306 each, Rfl: 3   acetaminophen (TYLENOL) 500 MG tablet, Take 500 mg by mouth every 6 (six) hours as needed for mild pain. (Patient not taking: Reported on 06/18/2023), Disp: , Rfl:    acetone, urine, test strip, Check ketones per protocol (Patient not taking: Reported on 12/11/2021), Disp: 50 each, Rfl: 3   Alcohol Swabs (ALCOHOL PADS) 70 % PADS, Wipe skin prior to injection (Patient not taking: Reported on 06/18/2023), Disp: 200 each, Rfl: 6   BD PEN NEEDLE NANO 2ND GEN 32G X 4 MM MISC, USE WITH INSULIN PEN 6 TIMES DAILY (Patient not taking: Reported on 06/18/2023), Disp: 200 each, Rfl: 5   Continuous Blood Gluc Sensor (DEXCOM G7 SENSOR) MISC, Place 1 sensor on the skin every 10 days, Disp: 3 each, Rfl: 6   Continuous Glucose Sensor (DEXCOM G6 SENSOR) MISC, Change every 10 Days., Disp: 3 each, Rfl: 5   Continuous Glucose Transmitter (DEXCOM G6 TRANSMITTER) MISC, USE AS DIRECTED, REUSE FOR 3 MONTHS, Disp: 1 each, Rfl: 1   Glucagon (BAQSIMI TWO PACK) 3 MG/DOSE POWD, Place 1 application into the nose as needed. Use as directed if unconscious, unable to take food po, or having a seizure due to hypoglycemia  (Patient not taking: Reported on 06/18/2023), Disp: 2 each, Rfl: 1   glucose blood (ACCU-CHEK GUIDE) test strip, Use to check BG 6 times daily (Patient not taking: Reported on 06/18/2023), Disp: 200 each, Rfl: 8   insulin aspart (NOVOLOG) 100 UNIT/ML injection, Inject up to 200 units into insulin pump every 2 days. Please fill for insulin VIAL., Disp: 30 mL, Rfl: 5   Insulin Disposable Pump (OMNIPOD 5 G6 INTRO, GEN 5,) KIT, Inject 1 kit into the skin as directed. . Change pod every 2 days. Intro kit comes with 2 boxes of pods, PDM device, pod pals, and user manual. Please fill for Omnipod 5 Into kit NDC [phone] (Patient not taking: Reported on 03/21/2022), Disp: 1 kit, Rfl: 2   Insulin Glargine (LANTUS SOLOSTAR) 100 UNIT/ML Solostar Pen, Up to 50 units per day as directed by MD (Patient not taking: Reported on 06/18/2023), Disp: 15 mL, Rfl: 3   Multiple Vitamins-Minerals (MULTIVITAMIN ADULT, MINERALS,) TABS, Take by mouth. (Patient not taking: Reported on 06/18/2023), Disp: , Rfl:    NOVOLOG FLEXPEN 100 UNIT/ML FlexPen, INJECT UP TO 50 UNITS DAILY PER PROVIDER INSTRUCTION, Disp: 15 mL, Rfl: 5   TRESIBA FLEXTOUCH 100 UNIT/ML FlexTouch Pen, ADMINISTER UP TO 50 UNITS UNDER THE SKIN DAILY AS DIRECTED BY PRESCRIBER, Disp: 45 mL, Rfl: 6   Medications ordered in this encounter:  No orders of the defined types were placed in this encounter.    *If you need refills on other medications prior to your next appointment,  please contact your pharmacy*  Follow-Up: Call back or seek an in-person evaluation if the symptoms worsen or if the condition fails to improve as anticipated.  [facility] [phone]  Other Instructions Food Poisoning Food poisoning is an illness that is caused by eating or drinking contaminated foods or drinks. In most cases, food poisoning is mild and lasts 2-7 days. However, some cases can be serious, especially for people who have a weak body defense system (immune  system), like older people, children and infants, and pregnant women. What are the causes? This condition is caused by contaminated food or drinks. Foods can become contaminated with viruses, bacteria, parasites, or mold due to: Poor personal hygiene, such as poor hand-washing practices. Storing food improperly, such as not refrigerating raw meat. Using unclean surfaces for preparing, serving, and storing food. Cooking or eating with unclean utensils. Not cooking food to the right temperature. If contaminated food is eaten, viruses, bacteria, or parasites can harm the intestine. This often causes severe diarrhea. The most common causes of food poisoning include: Viruses, such as: Norovirus. Rotavirus. Bacteria, such as: Salmonella. Listeria. E. coli (Escherichia coli). Parasites, such as: Giardia. Toxoplasma gondii. What are the signs or symptoms? Symptoms may take several hours to appear after you consume contaminated food or drink. Symptoms include: Nausea. Vomiting. Cramping or abdominal pain. Diarrhea. Fever and chills. Muscle aches. Dehydration. Dehydration can cause you to be tired and thirsty, have a dry mouth, and urinate less frequently. How is this diagnosed? Your health care provider can diagnose food poisoning with your medical history and a physical exam. This will include asking you what you have recently eaten. You may also have tests, including: Blood tests. Stool tests. How is this treated? Treatment focuses on relieving your symptoms and making sure that you are hydrated. You may also be given medicines. In severe cases, hospitalization may be required and you may need to receive fluids through an IV. Follow these instructions at home: Eating and drinking  Drink enough fluids to keep your urine pale yellow. You may need to drink small amounts of clear liquids frequently. Avoid milk, caffeine, and alcohol. Ask your health care provider for specific rehydration  instructions. Eat small, frequent meals rather than large meals. Medicines Take over-the-counter and prescription medicines only as told by your health care provider. Ask your health care provider if you should continue to take any of your regular prescribed and over-the-counter medicines. If you were prescribed an antibiotic medicine, take it as told by your health care provider. Do not stop using the antibiotic even if you start to feel better. General instructions  Wash your hands with soap and water for at least 20 seconds before you prepare food and after you go to the bathroom (use the toilet). Make sure that the people who live with you also wash their hands often. Rest at home until you feel better. Clean surfaces that you touch with a product that contains chlorine bleach. Keep all follow-up visits. This is important. How is this prevented? Wash your hands, food preparation surfaces, and utensils thoroughly before and after you handle raw foods. Use separate food preparation surfaces and storage spaces for raw meat and for fruits and vegetables. Keep refrigerated foods colder than 39F (5C). Serve hot foods immediately or keep them heated above 139F (60C). Store dry foods in cool, dry spaces away from excess heat or moisture. Throw out any foods that do not smell right or are in cans that are  bulging. Heat canned foods thoroughly before you taste them. Drink bottled or sterile water when you travel. Get help right away if: You have difficulty breathing, swallowing, talking, or moving. You develop blurred vision. You faint. Your eyes turn yellow. Your vomiting or diarrhea is persistent or it affects your eating and drinking. Abdominal pain develops, increases, or localizes in one small area. You have a fever. You have blood or mucus in your stools, or your stools look dark black and tarry. You have signs of dehydration, such as: Dark urine, very little urine, or no urine. Not  making tears while crying. Dry mouth or lips. Sunken eyes. Sleepiness. Weakness. Dizziness. These symptoms may be an emergency. Get help right away. Call 911. Do not wait to see if the symptoms will go away. Do not drive yourself to the hospital. Summary Food poisoning is an illness that is caused by eating or drinking contaminated foods or drinks. Symptoms may include nausea, vomiting, diarrhea, muscle aches, cramping, fever, chills, and dehydration. In most cases, food poisoning is mild and lasts 2-7 days. In severe cases, hospitalization may be required. This information is not intended to replace advice given to you by your health care provider. Make sure you discuss any questions you have with your health care provider. Document Revised: 10/08/2021 Document Reviewed: 10/08/2021 Elsevier Patient Education  2024 Elsevier Inc.   If you have been instructed to have an in-person evaluation today at a local Urgent Care facility, please use the link below. It will take you to a list of all of our available [facility], including address, phone number and hours of operation. Please do not delay care.  [facility]  If you or a family member do not have a primary care provider, use the link below to schedule a visit and establish care. When you choose a [facility] primary care physician or advanced practice provider, you gain a long-term partner in health. Find a Primary Care Provider  Learn more about [facility] in-office and virtual care options: [facility] - Get Care Now

## 2023-12-22 NOTE — Progress Notes (Signed)
Virtual Visit Consent   Michael [name], you are scheduled for a virtual visit with a [facility] provider today. Just as with appointments in the office, your consent must be obtained to participate. Your consent will be active for this visit and any virtual visit you may have with one of our providers in the next 365 days. If you have a MyChart account, a copy of this consent can be sent to you electronically.  As this is a virtual visit, video technology does not allow for your provider to perform a traditional examination. This may limit your provider's ability to fully assess your condition. If your provider identifies any concerns that need to be evaluated in person or the need to arrange testing (such as labs, EKG, etc.), we will make arrangements to do so. Although advances in technology are sophisticated, we cannot ensure that it will always work on either your end or our end. If the connection with a video visit is poor, the visit may have to be switched to a telephone visit. With either a video or telephone visit, we are not always able to ensure that we have a secure connection.  By engaging in this virtual visit, you consent to the provision of healthcare and authorize for your insurance to be billed (if applicable) for the services provided during this visit. Depending on your insurance coverage, you may receive a charge related to this service.  I need to obtain your verbal consent now. Are you willing to proceed with your visit today? Michael [name] has provided verbal consent on 12/22/2023 for a virtual visit (video or telephone). Michael Loveless, PA-C  Date: 12/22/2023 8:37 AM  Virtual Visit via Video Note   I, Michael [name], connected with  Michael [name]  ([id number], June 08, 2001) on 12/22/23 at  8:30 AM EST by a video-enabled telemedicine application and verified that I am speaking with the correct person using two identifiers.  Location: Patient: Virtual Visit Location  Patient: Home Provider: Virtual Visit Location Provider: Home Office   I discussed the limitations of evaluation and management by telemedicine and the availability of in person appointments. The patient expressed understanding and agreed to proceed.    History of Present Illness: Michael [name] is a 23 y.o. who identifies as a male who was assigned male at birth, and is being seen today for nausea and vomiting.  HPI: Emesis  This is a new problem. The current episode started today (happened around 4 am). The problem occurs 2 to 4 times per day. The problem has been gradually improving. The emesis has an appearance of stomach contents. There has been no fever. Associated symptoms include abdominal pain (improving) and diarrhea (once). Pertinent negatives include no arthralgias, chest pain, chills, fever, headaches or myalgias. Associated symptoms comments: fatigue. Risk factors include suspect food intake. He has tried nothing for the symptoms. The treatment provided no relief.     Problems:  Patient Active Problem List   Diagnosis Date Noted   Concussion wth loss of consciousness of 30 minutes or less 10/03/2020   Newly diagnosed diabetes (HCC) 12/11/2018   Weakness 12/11/2018   Oral thrush 12/11/2018   DKA, type 1 (HCC) 12/08/2018   DKA (diabetic ketoacidosis) (HCC) 12/08/2018    Allergies:  Allergies  Allergen Reactions   Penicillins Hives and Swelling    DID THE REACTION INVOLVE: Swelling of the face/tongue/throat, SOB, or low BP? Yes Sudden or severe rash/hives, skin peeling, or the inside of the mouth or nose? Yes  Did it require medical treatment? Yes When did it last happen?       If all above answers are "NO", may proceed with cephalosporin use.    Medications:  Current Outpatient Medications:    ACCU-CHEK FASTCLIX LANCETS MISC, Check sugar 10 x daily (Patient not taking: Reported on 06/18/2023), Disp: 306 each, Rfl: 3   acetaminophen (TYLENOL) 500 MG tablet, Take 500 mg by  mouth every 6 (six) hours as needed for mild pain. (Patient not taking: Reported on 06/18/2023), Disp: , Rfl:    acetone, urine, test strip, Check ketones per protocol (Patient not taking: Reported on 12/11/2021), Disp: 50 each, Rfl: 3   Alcohol Swabs (ALCOHOL PADS) 70 % PADS, Wipe skin prior to injection (Patient not taking: Reported on 06/18/2023), Disp: 200 each, Rfl: 6   BD PEN NEEDLE NANO 2ND GEN 32G X 4 MM MISC, USE WITH INSULIN PEN 6 TIMES DAILY (Patient not taking: Reported on 06/18/2023), Disp: 200 each, Rfl: 5   Continuous Blood Gluc Sensor (DEXCOM G7 SENSOR) MISC, Place 1 sensor on the skin every 10 days, Disp: 3 each, Rfl: 6   Continuous Glucose Sensor (DEXCOM G6 SENSOR) MISC, Change every 10 Days., Disp: 3 each, Rfl: 5   Continuous Glucose Transmitter (DEXCOM G6 TRANSMITTER) MISC, USE AS DIRECTED, REUSE FOR 3 MONTHS, Disp: 1 each, Rfl: 1   Glucagon (BAQSIMI TWO PACK) 3 MG/DOSE POWD, Place 1 application into the nose as needed. Use as directed if unconscious, unable to take food po, or having a seizure due to hypoglycemia (Patient not taking: Reported on 06/18/2023), Disp: 2 each, Rfl: 1   glucose blood (ACCU-CHEK GUIDE) test strip, Use to check BG 6 times daily (Patient not taking: Reported on 06/18/2023), Disp: 200 each, Rfl: 8   insulin aspart (NOVOLOG) 100 UNIT/ML injection, Inject up to 200 units into insulin pump every 2 days. Please fill for insulin VIAL., Disp: 30 mL, Rfl: 5   Insulin Disposable Pump (OMNIPOD 5 G6 INTRO, GEN 5,) KIT, Inject 1 kit into the skin as directed. . Change pod every 2 days. Intro kit comes with 2 boxes of pods, PDM device, pod pals, and user manual. Please fill for Omnipod 5 Into kit NDC [phone] (Patient not taking: Reported on 03/21/2022), Disp: 1 kit, Rfl: 2   Insulin Glargine (LANTUS SOLOSTAR) 100 UNIT/ML Solostar Pen, Up to 50 units per day as directed by MD (Patient not taking: Reported on 06/18/2023), Disp: 15 mL, Rfl: 3   Multiple Vitamins-Minerals  (MULTIVITAMIN ADULT, MINERALS,) TABS, Take by mouth. (Patient not taking: Reported on 06/18/2023), Disp: , Rfl:    NOVOLOG FLEXPEN 100 UNIT/ML FlexPen, INJECT UP TO 50 UNITS DAILY PER PROVIDER INSTRUCTION, Disp: 15 mL, Rfl: 5   TRESIBA FLEXTOUCH 100 UNIT/ML FlexTouch Pen, ADMINISTER UP TO 50 UNITS UNDER THE SKIN DAILY AS DIRECTED BY PRESCRIBER, Disp: 45 mL, Rfl: 6  Observations/Objective: Patient is well-developed, well-nourished in no acute distress.  Resting comfortably at home.  Head is normocephalic, atraumatic.  No labored breathing.  Speech is clear and coherent with logical content.  Patient is alert and oriented at baseline.    Assessment and Plan: 1. Nausea and vomiting, unspecified vomiting type (Primary)  - Suspected food intake, ate food that had sat out for a few hours - Symptoms improving already - No medications needed - Push fluids   Follow Up Instructions: I discussed the assessment and treatment plan with the patient. The patient was provided an opportunity to ask questions and all were  answered. The patient agreed with the plan and demonstrated an understanding of the instructions.  A copy of instructions were sent to the patient via MyChart unless otherwise noted below.    The patient was advised to call back or seek an in-person evaluation if the symptoms worsen or if the condition fails to improve as anticipated.    Michael Loveless, PA-C

## 2023-12-25 ENCOUNTER — Telehealth: Payer: 59 | Admitting: Physician Assistant

## 2023-12-25 DIAGNOSIS — J069 Acute upper respiratory infection, unspecified: Secondary | ICD-10-CM

## 2023-12-25 MED ORDER — BENZONATATE 100 MG PO CAPS: 100.0000 mg | ORAL_CAPSULE | Freq: Three times a day (TID) | ORAL | 0 refills | Status: AC | PRN

## 2023-12-25 NOTE — Progress Notes (Signed)
Virtual Visit Consent   Michael [name], you are scheduled for a virtual visit with a  provider today. Just as with appointments in the office, your consent must be obtained to participate. Your consent will be active for this visit and any virtual visit you may have with one of our providers in the next 365 days. If you have a MyChart account, a copy of this consent can be sent to you electronically.  As this is a virtual visit, video technology does not allow for your provider to perform a traditional examination. This may limit your provider's ability to fully assess your condition. If your provider identifies any concerns that need to be evaluated in person or the need to arrange testing (such as labs, EKG, etc.), we will make arrangements to do so. Although advances in technology are sophisticated, we cannot ensure that it will always work on either your end or our end. If the connection with a video visit is poor, the visit may have to be switched to a telephone visit. With either a video or telephone visit, we are not always able to ensure that we have a secure connection.  By engaging in this virtual visit, you consent to the provision of healthcare and authorize for your insurance to be billed (if applicable) for the services provided during this visit. Depending on your insurance coverage, you may receive a charge related to this service.  I need to obtain your verbal consent now. Are you willing to proceed with your visit today? Michael [name] has provided verbal consent on 12/25/2023 for a virtual visit (video or telephone). Michael [name], [place]  Date: 12/25/2023 10:20 AM  Virtual Visit via Video Note   I, Michael [name], connected with  Michael [name]  ([id number], 13-May-2001) on 12/25/23 at 10:15 AM EST by a video-enabled telemedicine application and verified that I am speaking with the correct person using two identifiers.  Location: Patient: Virtual Visit Location  Patient: Home Provider: Virtual Visit Location Provider: Home Office   I discussed the limitations of evaluation and management by telemedicine and the availability of in person appointments. The patient expressed understanding and agreed to proceed.    History of Present Illness: Michael [name] is a 23 y.o. who identifies as a male who was assigned male at birth, and is being seen today for 1 day of cough that is productive of phlegm, nasal congestion and rhinorrhea. Mild aches. Denies fever, chills. Was seen 3 days ago for viral GE but notes resolution of symptoms.   OTC -- nothing.  HPI: HPI  Problems:  Patient Active Problem List   Diagnosis Date Noted   Concussion wth loss of consciousness of 30 minutes or less 10/03/2020   Newly diagnosed diabetes (HCC) 12/11/2018   Weakness 12/11/2018   Oral thrush 12/11/2018   DKA, type 1 (HCC) 12/08/2018   DKA (diabetic ketoacidosis) (HCC) 12/08/2018    Allergies:  Allergies  Allergen Reactions   Penicillins Hives and Swelling    DID THE REACTION INVOLVE: Swelling of the face/tongue/throat, SOB, or low BP? Yes Sudden or severe rash/hives, skin peeling, or the inside of the mouth or nose? Yes Did it require medical treatment? Yes When did it last happen?       If all above answers are "NO", may proceed with cephalosporin use.    Medications:  Current Outpatient Medications:    benzonatate (TESSALON) 100 MG capsule, Take 1 capsule (100 mg total) by mouth 3 (three) times daily as  needed for cough., Disp: 30 capsule, Rfl: 0   Continuous Blood Gluc Sensor (DEXCOM G7 SENSOR) MISC, Place 1 sensor on the skin every 10 days, Disp: 3 each, Rfl: 6   Continuous Glucose Sensor (DEXCOM G6 SENSOR) MISC, Change every 10 Days., Disp: 3 each, Rfl: 5   Continuous Glucose Transmitter (DEXCOM G6 TRANSMITTER) MISC, USE AS DIRECTED, REUSE FOR 3 MONTHS, Disp: 1 each, Rfl: 1   Glucagon (BAQSIMI TWO PACK) 3 MG/DOSE POWD, Place 1 application into the nose as  needed. Use as directed if unconscious, unable to take food po, or having a seizure due to hypoglycemia (Patient not taking: Reported on 06/18/2023), Disp: 2 each, Rfl: 1   glucose blood (ACCU-CHEK GUIDE) test strip, Use to check BG 6 times daily (Patient not taking: Reported on 06/18/2023), Disp: 200 each, Rfl: 8   insulin aspart (NOVOLOG) 100 UNIT/ML injection, Inject up to 200 units into insulin pump every 2 days. Please fill for insulin VIAL., Disp: 30 mL, Rfl: 5   Insulin Disposable Pump (OMNIPOD 5 G6 INTRO, GEN 5,) KIT, Inject 1 kit into the skin as directed. . Change pod every 2 days. Intro kit comes with 2 boxes of pods, PDM device, pod pals, and user manual. Please fill for Omnipod 5 Into kit NDC [phone] (Patient not taking: Reported on 03/21/2022), Disp: 1 kit, Rfl: 2   Insulin Glargine (LANTUS SOLOSTAR) 100 UNIT/ML Solostar Pen, Up to 50 units per day as directed by MD (Patient not taking: Reported on 06/18/2023), Disp: 15 mL, Rfl: 3   NOVOLOG FLEXPEN 100 UNIT/ML FlexPen, INJECT UP TO 50 UNITS DAILY PER PROVIDER INSTRUCTION, Disp: 15 mL, Rfl: 5   TRESIBA FLEXTOUCH 100 UNIT/ML FlexTouch Pen, ADMINISTER UP TO 50 UNITS UNDER THE SKIN DAILY AS DIRECTED BY PRESCRIBER, Disp: 45 mL, Rfl: 6  Observations/Objective: Patient is well-developed, well-nourished in no acute distress.  Resting comfortably at home.  Head is normocephalic, atraumatic.  No labored breathing. Speech is clear and coherent with logical content.  Patient is alert and oriented at baseline.   Assessment and Plan: 1. Viral URI with cough (Primary) - benzonatate (TESSALON) 100 MG capsule; Take 1 capsule (100 mg total) by mouth 3 (three) times daily as needed for cough.  Dispense: 30 capsule; Refill: 0  Supportive measures and OTC medications reviewed. Tessalon per orders.   Follow Up Instructions: I discussed the assessment and treatment plan with the patient. The patient was provided an opportunity to ask questions and  all were answered. The patient agreed with the plan and demonstrated an understanding of the instructions.  A copy of instructions were sent to the patient via MyChart unless otherwise noted below.   The patient was advised to call back or seek an in-person evaluation if the symptoms worsen or if the condition fails to improve as anticipated.    Michael Climes, PA-C

## 2023-12-25 NOTE — Patient Instructions (Signed)
Michael [name], thank you for joining [name], PA-C for today's virtual visit.  While this provider is not your primary care provider (PCP), if your PCP is located in our provider database this encounter information will be shared with them immediately following your visit.   A [facility] MyChart account gives you access to today's visit and all your visits, tests, and labs performed at [facility] " click here if you don't have a [facility] MyChart account or go to [email or web address]  Consent: (Patient) Michael [name] provided verbal consent for this virtual visit at the beginning of the encounter.  Current Medications:  Current Outpatient Medications:    ACCU-CHEK FASTCLIX LANCETS MISC, Check sugar 10 x daily (Patient not taking: Reported on 06/18/2023), Disp: 306 each, Rfl: 3   acetaminophen (TYLENOL) 500 MG tablet, Take 500 mg by mouth every 6 (six) hours as needed for mild pain. (Patient not taking: Reported on 06/18/2023), Disp: , Rfl:    acetone, urine, test strip, Check ketones per protocol (Patient not taking: Reported on 12/11/2021), Disp: 50 each, Rfl: 3   Alcohol Swabs (ALCOHOL PADS) 70 % PADS, Wipe skin prior to injection (Patient not taking: Reported on 06/18/2023), Disp: 200 each, Rfl: 6   BD PEN NEEDLE NANO 2ND GEN 32G X 4 MM MISC, USE WITH INSULIN PEN 6 TIMES DAILY (Patient not taking: Reported on 06/18/2023), Disp: 200 each, Rfl: 5   Continuous Blood Gluc Sensor (DEXCOM G7 SENSOR) MISC, Place 1 sensor on the skin every 10 days, Disp: 3 each, Rfl: 6   Continuous Glucose Sensor (DEXCOM G6 SENSOR) MISC, Change every 10 Days., Disp: 3 each, Rfl: 5   Continuous Glucose Transmitter (DEXCOM G6 TRANSMITTER) MISC, USE AS DIRECTED, REUSE FOR 3 MONTHS, Disp: 1 each, Rfl: 1   Glucagon (BAQSIMI TWO PACK) 3 MG/DOSE POWD, Place 1 application into the nose as needed. Use as directed if unconscious, unable to take food po, or having a seizure due to hypoglycemia  (Patient not taking: Reported on 06/18/2023), Disp: 2 each, Rfl: 1   glucose blood (ACCU-CHEK GUIDE) test strip, Use to check BG 6 times daily (Patient not taking: Reported on 06/18/2023), Disp: 200 each, Rfl: 8   insulin aspart (NOVOLOG) 100 UNIT/ML injection, Inject up to 200 units into insulin pump every 2 days. Please fill for insulin VIAL., Disp: 30 mL, Rfl: 5   Insulin Disposable Pump (OMNIPOD 5 G6 INTRO, GEN 5,) KIT, Inject 1 kit into the skin as directed. . Change pod every 2 days. Intro kit comes with 2 boxes of pods, PDM device, pod pals, and user manual. Please fill for Omnipod 5 Into kit NDC [phone] (Patient not taking: Reported on 03/21/2022), Disp: 1 kit, Rfl: 2   Insulin Glargine (LANTUS SOLOSTAR) 100 UNIT/ML Solostar Pen, Up to 50 units per day as directed by MD (Patient not taking: Reported on 06/18/2023), Disp: 15 mL, Rfl: 3   Multiple Vitamins-Minerals (MULTIVITAMIN ADULT, MINERALS,) TABS, Take by mouth. (Patient not taking: Reported on 06/18/2023), Disp: , Rfl:    NOVOLOG FLEXPEN 100 UNIT/ML FlexPen, INJECT UP TO 50 UNITS DAILY PER PROVIDER INSTRUCTION, Disp: 15 mL, Rfl: 5   TRESIBA FLEXTOUCH 100 UNIT/ML FlexTouch Pen, ADMINISTER UP TO 50 UNITS UNDER THE SKIN DAILY AS DIRECTED BY PRESCRIBER, Disp: 45 mL, Rfl: 6   Medications ordered in this encounter:  No orders of the defined types were placed in this encounter.    *If you need refills on other medications prior to your next appointment,  please contact your pharmacy*  Follow-Up: Call back or seek an in-person evaluation if the symptoms worsen or if the condition fails to improve as anticipated.  [facility] [phone]  Other Instructions Keep hydrated and rest. Start an OTC saline rinse and Flonase nasal spray. You can use OTC Mucinex-DM as well for congestion and cough. Take prescribed medications as directed. Feel better soon!   If you have been instructed to have an in-person evaluation today at a  local Urgent Care facility, please use the link below. It will take you to a list of all of our available [place] [facility], including address, phone number and hours of operation. Please do not delay care.  [facility]  If you or a family member do not have a primary care provider, use the link below to schedule a visit and establish care. When you choose a [facility] primary care physician or advanced practice provider, you gain a long-term partner in health. Find a Primary Care Provider  Learn more about [facility]'s in-office and virtual care options:  - Get Care Now

## 2023-12-28 ENCOUNTER — Telehealth: Payer: 59 | Admitting: Family

## 2023-12-28 DIAGNOSIS — J069 Acute upper respiratory infection, unspecified: Secondary | ICD-10-CM

## 2023-12-28 NOTE — Progress Notes (Signed)
Virtual Visit Consent   Michael [name], you are scheduled for a virtual visit with a [facility] provider today. Just as with appointments in the office, your consent must be obtained to participate. Your consent will be active for this visit and any virtual visit you may have with one of our providers in the next 365 days. If you have a MyChart account, a copy of this consent can be sent to you electronically.  As this is a virtual visit, video technology does not allow for your provider to perform a traditional examination. This may limit your provider's ability to fully assess your condition. If your provider identifies any concerns that need to be evaluated in person or the need to arrange testing (such as labs, EKG, etc.), we will make arrangements to do so. Although advances in technology are sophisticated, we cannot ensure that it will always work on either your end or our end. If the connection with a video visit is poor, the visit may have to be switched to a telephone visit. With either a video or telephone visit, we are not always able to ensure that we have a secure connection.  By engaging in this virtual visit, you consent to the provision of healthcare and authorize for your insurance to be billed (if applicable) for the services provided during this visit. Depending on your insurance coverage, you may receive a charge related to this service.  I need to obtain your verbal consent now. Are you willing to proceed with your visit today? Michael [name] has provided verbal consent on 12/28/2023 for a virtual visit (video or telephone). Michael Rodney, FNP  Date: 12/28/2023 7:08 PM  Virtual Visit via Video Note   I, Michael [name], connected with  Michael [name]  ([id number], 12/08/2000) on 12/28/23 at  7:15 PM EST by a video-enabled telemedicine application and verified that I am speaking with the correct person using two identifiers.  Location: Patient: Virtual Visit Location Patient: Other:  car Provider: Virtual Visit Location Provider: Home Office   I discussed the limitations of evaluation and management by telemedicine and the availability of in person appointments. The patient expressed understanding and agreed to proceed.    History of Present Illness: Michael [name] is a 23 y.o. who identifies as a male who was assigned male at birth, and is being seen today for URI symptoms and needs a note for work.  HPI: URI  This is a new problem. The current episode started in the past 7 days. The problem has been resolved. There has been no fever. Pertinent negatives include no congestion, coughing, diarrhea, dysuria, headaches or nausea. Associated symptoms comments: Fatigue .    Problems:  Patient Active Problem List   Diagnosis Date Noted   Concussion wth loss of consciousness of 30 minutes or less 10/03/2020   Newly diagnosed diabetes (HCC) 12/11/2018   Weakness 12/11/2018   Oral thrush 12/11/2018   DKA, type 1 (HCC) 12/08/2018   DKA (diabetic ketoacidosis) (HCC) 12/08/2018    Allergies:  Allergies  Allergen Reactions   Penicillins Hives and Swelling    DID THE REACTION INVOLVE: Swelling of the face/tongue/throat, SOB, or low BP? Yes Sudden or severe rash/hives, skin peeling, or the inside of the mouth or nose? Yes Did it require medical treatment? Yes When did it last happen?       If all above answers are "NO", may proceed with cephalosporin use.    Medications:  Current Outpatient Medications:    benzonatate (TESSALON) 100  MG capsule, Take 1 capsule (100 mg total) by mouth 3 (three) times daily as needed for cough., Disp: 30 capsule, Rfl: 0   Continuous Blood Gluc Sensor (DEXCOM G7 SENSOR) MISC, Place 1 sensor on the skin every 10 days, Disp: 3 each, Rfl: 6   Continuous Glucose Sensor (DEXCOM G6 SENSOR) MISC, Change every 10 Days., Disp: 3 each, Rfl: 5   Continuous Glucose Transmitter (DEXCOM G6 TRANSMITTER) MISC, USE AS DIRECTED, REUSE FOR 3 MONTHS, Disp: 1  each, Rfl: 1   Glucagon (BAQSIMI TWO PACK) 3 MG/DOSE POWD, Place 1 application into the nose as needed. Use as directed if unconscious, unable to take food po, or having a seizure due to hypoglycemia (Patient not taking: Reported on 06/18/2023), Disp: 2 each, Rfl: 1   glucose blood (ACCU-CHEK GUIDE) test strip, Use to check BG 6 times daily (Patient not taking: Reported on 06/18/2023), Disp: 200 each, Rfl: 8   insulin aspart (NOVOLOG) 100 UNIT/ML injection, Inject up to 200 units into insulin pump every 2 days. Please fill for insulin VIAL., Disp: 30 mL, Rfl: 5   Insulin Disposable Pump (OMNIPOD 5 G6 INTRO, GEN 5,) KIT, Inject 1 kit into the skin as directed. . Change pod every 2 days. Intro kit comes with 2 boxes of pods, PDM device, pod pals, and user manual. Please fill for Omnipod 5 Into kit NDC [phone] (Patient not taking: Reported on 03/21/2022), Disp: 1 kit, Rfl: 2   Insulin Glargine (LANTUS SOLOSTAR) 100 UNIT/ML Solostar Pen, Up to 50 units per day as directed by MD (Patient not taking: Reported on 06/18/2023), Disp: 15 mL, Rfl: 3   NOVOLOG FLEXPEN 100 UNIT/ML FlexPen, INJECT UP TO 50 UNITS DAILY PER PROVIDER INSTRUCTION, Disp: 15 mL, Rfl: 5   TRESIBA FLEXTOUCH 100 UNIT/ML FlexTouch Pen, ADMINISTER UP TO 50 UNITS UNDER THE SKIN DAILY AS DIRECTED BY PRESCRIBER, Disp: 45 mL, Rfl: 6  Observations/Objective: Patient is well-developed, well-nourished in no acute distress.  Resting comfortably  at home.  Head is normocephalic, atraumatic.  No labored breathing.  Speech is clear and coherent with logical content.  Patient is alert and oriented at baseline.    Assessment and Plan: 1. Viral URI (Primary)  Continue tessalon and OTC medications Symptoms timproving - Take meds as prescribed - Use a cool mist humidifier  -Use saline nose sprays frequently -Force fluids -For any cough or congestion  Use plain Mucinex- regular strength or max strength is fine -For fever or aces or pains- take  tylenol or ibuprofen. -Throat lozenges if help -Work note given to return to work tomorrow  Follow Up Instructions: I discussed the assessment and treatment plan with the patient. The patient was provided an opportunity to ask questions and all were answered. The patient agreed with the plan and demonstrated an understanding of the instructions.  A copy of instructions were sent to the patient via MyChart unless otherwise noted below.     The patient was advised to call back or seek an in-person evaluation if the symptoms worsen or if the condition fails to improve as anticipated.    Michael Rodney, FNP

## 2023-12-28 NOTE — Patient Instructions (Signed)

## 2024-01-14 NOTE — Progress Notes (Unsigned)
Pediatric Endocrinology Consultation Follow-up Visit  Michael [name] January 04, 2001 [id number]  Chief Complaint: type 1 diabetes  HPI: Michael [name] is a 23 y.o. male presenting for follow-up of the above concerns.  he attended this visit alone.  1. Michael [name] was diagnosed with T1DM on 12/08/2018 (presented to [facility] with DKA initially).  At presentation, A1c elevated at 11.2%, C-peptide low at 0.7, islet cell ab negative, insulin Ab elevated at 6.3 (normal <5), GAD ab positive at 632.9 (normal <5), celiac screen negative.  He was started on an MDI regimen and discharged from the hospital on 12/11/18.  He started on a dexcom CGM in 01/2019.  2. Since last visit on 06/18/23, he has been well.   ED visits/Hospitalizations: None  Concerns:  -Has had ups and downs with blood sugars.    Insulin regimen:  Tresiba 26 units daily before bed. Novolog 125/50/8 plan.   Working on [employer] and back to [employer]  Starting a basketball league next week.    CGM download:  *** *** Interpretation: ***  Hypoglycemia: can feel lows.  No glucagon needed recently. Has Baqsimi Wearing Med-alert ID currently: not currently.  Reminded to wear something at all times Injection sites: Leg, arms Annual labs due: 06/2024 Ophthalmology due: Recent visit Spring 2024- no retina concerns.  Due now  ROS: All systems reviewed with pertinent positives listed below; otherwise negative.  Past Medical History:   Past Medical History:  Diagnosis Date   Type 1 diabetes (HCC)    Dx 12/2018, presented with DKA.  + Insulin Ab and GAD Ab.  Low C-peptide.  Negative celiac screen.    Meds: Outpatient Encounter Medications as of 01/15/2024  Medication Sig   glucose blood (ACCU-CHEK GUIDE) test strip Use to check BG 6 times daily   NOVOLOG FLEXPEN 100 UNIT/ML FlexPen INJECT UP TO 50 UNITS DAILY PER PROVIDER INSTRUCTION   TRESIBA FLEXTOUCH 100 UNIT/ML FlexTouch Pen ADMINISTER UP TO 50 UNITS UNDER THE SKIN DAILY  AS DIRECTED BY PRESCRIBER   benzonatate (TESSALON) 100 MG capsule Take 1 capsule (100 mg total) by mouth 3 (three) times daily as needed for cough. (Patient not taking: Reported on 01/15/2024)   Continuous Blood Gluc Sensor (DEXCOM G7 SENSOR) MISC Place 1 sensor on the skin every 10 days (Patient not taking: Reported on 01/15/2024)   Continuous Glucose Sensor (DEXCOM G6 SENSOR) MISC Change every 10 Days. (Patient not taking: Reported on 01/15/2024)   Continuous Glucose Transmitter (DEXCOM G6 TRANSMITTER) MISC USE AS DIRECTED, REUSE FOR 3 MONTHS (Patient not taking: Reported on 01/15/2024)   Glucagon (BAQSIMI TWO PACK) 3 MG/DOSE POWD Place 1 application into the nose as needed. Use as directed if unconscious, unable to take food po, or having a seizure due to hypoglycemia (Patient not taking: Reported on 01/15/2024)   insulin aspart (NOVOLOG) 100 UNIT/ML injection Inject up to 200 units into insulin pump every 2 days. Please fill for insulin VIAL. (Patient not taking: Reported on 01/15/2024)   Insulin Disposable Pump (OMNIPOD 5 G6 INTRO, GEN 5,) KIT Inject 1 kit into the skin as directed. . Change pod every 2 days. Intro kit comes with 2 boxes of pods, PDM device, pod pals, and user manual. Please fill for Omnipod 5 Into kit NDC [phone] (Patient not taking: Reported on 01/15/2024)   Insulin Glargine (LANTUS SOLOSTAR) 100 UNIT/ML Solostar Pen Up to 50 units per day as directed by MD (Patient not taking: Reported on 06/18/2023)   No facility-administered encounter medications on file  as of 01/15/2024.   Allergies: Allergies  Allergen Reactions   Penicillins Hives and Swelling    DID THE REACTION INVOLVE: Swelling of the face/tongue/throat, SOB, or low BP? Yes Sudden or severe rash/hives, skin peeling, or the inside of the mouth or nose? Yes Did it require medical treatment? Yes When did it last happen?       If all above answers are "NO", may proceed with cephalosporin use.    Surgical  History: History reviewed. No pertinent surgical history.   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 Growth %ile SmartLinks can only be used for patients less than 69 years old.  Wt Readings from Last 3 Encounters:  01/15/24 132 lb 12.8 oz (60.2 kg)  06/18/23 140 lb (63.5 kg)  02/05/23 146 lb 6.4 oz (66.4 kg) (35%, Z= -0.39)*   * Growth percentiles are based on CDC (Boys, 2-20 Years) data.   Ht Readings from Last 3 Encounters:  06/18/23 5\' 8"  (1.727 m)  02/05/23 5' 8.03" (1.728 m) (29%, Z= -0.57)*  12/11/21 5' 7.72" (1.72 m) (26%, Z= -0.63)*   * Growth percentiles are based on CDC (Boys, 2-20 Years) data.   General: Well developed, well nourished ***male in no acute distress.  Appears *** stated age Head: Normocephalic, atraumatic.   Eyes:  Pupils equal and round. EOMI.   Sclera white.  No eye drainage.   Ears/Nose/Mouth/Throat: Nares patent, no nasal drainage.  Moist mucous membranes, normal dentition Neck: supple, no cervical lymphadenopathy, no thyromegaly Cardiovascular: regular rate, normal S1/S2, no murmurs Respiratory: No increased work of breathing.  Lungs clear to auscultation bilaterally.  No wheezes. Abdomen: soft, nontender, nondistended.  Extremities: warm, well perfused, cap refill < 2 sec.   Musculoskeletal: Normal muscle mass.  Normal strength Skin: warm, dry.  No rash or lesions. Neurologic: alert and oriented, normal speech, no tremor    Labs: Results for orders placed or performed in visit on 01/15/24  POCT Glucose (Device for Home Use)   Collection Time: 01/15/24 11:02 AM  Result Value Ref Range   Glucose Fasting, POC 160 (A) 70 - 99 mg/dL   POC Glucose    POCT glycosylated hemoglobin (Hb A1C)   Collection Time: 01/15/24 11:02 AM  Result Value Ref Range   Hemoglobin A1C 11.8 (A) 4.0 - 5.6 %   HbA1c POC (<> result, manual entry)     HbA1c, POC (prediabetic range)     HbA1c, POC (controlled diabetic range)      A1c trend: 11.7% 12/2018 at diagnosis--> 11.2% 08/2019--> 12.9% 11/2019--> 9.4% 01/2020--> 10.2% 05/2020-->9.7% 07/2020--> 8.2% 09/2020--> 9% 11/09/20--> 14% 03/2021--> 10.6% 09/2021--> >14% 12/2021-->9.3% 03/2022--> 9.1% 06/2022--> 8.5% 10/2022--> 9.2% 01/2023--> 9.5% 06/2023-->11.8% 01/2024  Assessment/Plan: Michael [name] is a 23 y.o. male with T1DM on an MDI ***and CGM regimen.   A1c is {higher/lower:18993} than last visit. The ADA  goal for A1c is <7.0%.  Dexcom tracing shows he {ACTION; IS/IS ZOX:[id number]} meeting goal of TIR >70%.  he needs {abjmoreinsulin:29737}  When a patient is on insulin, intensive monitoring of blood glucose levels and continuous insulin titration is vital to avoid insulin toxicity leading to severe hypoglycemia. Severe hypoglycemia can lead to seizure or death. Hyperglycemia can also result from inadequate insulin dosing and can lead to ketosis requiring ICU admission and intravenous insulin.   1. ***Type 1 diabetes with hyperglycemia {abjT1DMplan:29739::"-POC glucose and A1c as above","-CGM download reviewed extensively (see interpretation above)","--Encouraged to wear med alert ID every day","-Encouraged to rotate injection sites","-Provided with my contact information and advised to email/send mychart with questions/need for BG review"}  ***2. Insulin dose change/High Risk Medication Use (Insulin) -Made the following insulin changes: ***   Follow-up:   No follow-ups on file. Transfer  to Adult endocrine  ***  Michael Needle, MD

## 2024-01-14 NOTE — Patient Instructions (Signed)
It was a pleasure to see you in clinic today.   Feel free to contact our office during normal business hours at [phone] with questions or concerns. If you have an emergency after normal business hours, please call the above number to reach our answering service who will contact the on-call pediatric endocrinologist.  If you choose to communicate with [place] via MyChart, please do not send urgent messages as this inbox is NOT monitored on nights or weekends.  Urgent concerns should be discussed with the on-call pediatric endocrinologist.  -Always have fast sugar with you in case of low blood sugar (glucose tabs, regular juice or soda, candy) -Always wear your ID that states you have diabetes -Always bring your meter/continuous glucose monitor to your visit  Hypoglycemia  Shaking or trembling. Sweating and chills. Dizziness or lightheadedness. Faster heart rate. Headaches. Hunger. Nausea. Nervousness or irritability. Pale skin. Restless sleep. Weakness. Blurry vision. Confusion or trouble concentrating. Sleepiness. Slurred speech. Tingling or numbness in the face or mouth.  How do I treat an episode of hypoglycemia? The American Diabetes Association recommends the "15-15 rule" for an episode of hypoglycemia: Eat or drink 15 grams of fast-acting carbs (4oz regular soda or juice, 1 pkg fruit snacks, 4 glucose tabs) to raise your blood sugar. After 15 minutes, check your blood sugar. If it's still below 80 mg/dL, have another 15 grams of fast-acting carbs. Repeat until your blood sugar is at least 80 mg/dL.  Hyperglycemia  Frequent urination Increased thirst Blurred vision Fatigue Headache          Diabetic Ketoacidosis (DKA)  If hyperglycemia goes untreated, it can cause toxic acids (ketones) to build up in your blood and urine (ketoacidosis). Signs and symptoms include: Fruity-smelling breath Nausea and vomiting Shortness of breath Dry  mouth Weakness Confusion Coma Abdominal pain  Sick day/Ketones Protocol  Check blood glucose every 3 hours  Give rapid acting insulin correction dose           every 3 hours until ketones are negative Check urine ketones every 2 hours (until ketones are negative)  Drink plenty of fluids (water, Pedialyte) every hour Notify clinic of sickness/ketones  If you develop signs of DKA (especially vomiting or abdominal pain and inability to drink), go to [facility] Emergency room immediately.   Hemoglobin A1c levels

## 2024-01-15 ENCOUNTER — Encounter (INDEPENDENT_AMBULATORY_CARE_PROVIDER_SITE_OTHER): Payer: Self-pay | Admitting: Pediatrics

## 2024-01-15 ENCOUNTER — Ambulatory Visit (INDEPENDENT_AMBULATORY_CARE_PROVIDER_SITE_OTHER): Payer: 59 | Admitting: Pediatrics

## 2024-01-15 ENCOUNTER — Telehealth (INDEPENDENT_AMBULATORY_CARE_PROVIDER_SITE_OTHER): Payer: Self-pay

## 2024-01-15 ENCOUNTER — Other Ambulatory Visit (HOSPITAL_COMMUNITY): Payer: Self-pay

## 2024-01-15 VITALS — BP 100/74 | HR 82 | Wt 132.8 lb

## 2024-01-15 DIAGNOSIS — Z79899 Other long term (current) drug therapy: Secondary | ICD-10-CM

## 2024-01-15 DIAGNOSIS — E1065 Type 1 diabetes mellitus with hyperglycemia: Secondary | ICD-10-CM | POA: Diagnosis not present

## 2024-01-15 DIAGNOSIS — Z794 Long term (current) use of insulin: Secondary | ICD-10-CM

## 2024-01-15 DIAGNOSIS — E1010 Type 1 diabetes mellitus with ketoacidosis without coma: Secondary | ICD-10-CM

## 2024-01-15 LAB — POCT GLYCOSYLATED HEMOGLOBIN (HGB A1C): Hemoglobin A1C: 11.8 % — AB (ref 4.0–5.6)

## 2024-01-15 LAB — POCT GLUCOSE (DEVICE FOR HOME USE): Glucose Fasting, POC: 160 mg/dL — AB (ref 70–99)

## 2024-01-15 MED ORDER — DEXCOM G6 TRANSMITTER MISC: 1 refills | Status: DC

## 2024-01-15 MED ORDER — DEXCOM G6 SENSOR MISC: 5 refills | Status: DC

## 2024-01-15 MED ORDER — DEXCOM G6 TRANSMITTER MISC
1 refills | Status: DC
  Filled 2024-01-15: qty 1, fill #0

## 2024-01-15 MED ORDER — DEXCOM G6 SENSOR MISC: 5 refills | Status: AC

## 2024-01-15 MED ORDER — BAQSIMI TWO PACK 3 MG/DOSE NA POWD: 1.0000 | NASAL | 1 refills | Status: AC | PRN

## 2024-01-15 MED ORDER — DEXCOM G6 TRANSMITTER MISC: 1 refills | Status: AC

## 2024-01-15 MED ORDER — DEXCOM G6 SENSOR MISC
5 refills | Status: DC
  Filled 2024-01-15: qty 3, fill #0

## 2024-01-15 NOTE — Telephone Encounter (Signed)
PA started for Dexcom G6 per Dr. Larinda Buttery

## 2024-01-15 NOTE — Telephone Encounter (Signed)
Marland Kitchen

## 2024-02-18 ENCOUNTER — Telehealth: Admitting: Family Medicine

## 2024-02-18 DIAGNOSIS — R112 Nausea with vomiting, unspecified: Secondary | ICD-10-CM | POA: Diagnosis not present

## 2024-02-18 NOTE — Patient Instructions (Addendum)
 Michael [name], thank you for joining Michael Finner, NP for today's virtual visit.  While this provider is not your primary care provider (PCP), if your PCP is located in our provider database this encounter information will be shared with them immediately following your visit.   A [place] [name] account gives you access to today's visit and all your visits, tests, and labs performed at [facility] " click here if you don't have a Michael [name] account or go to [name]  Consent: (Patient) Michael [name] provided verbal consent for this virtual visit at the beginning of the encounter.  Current Medications:  Current Outpatient Medications:    benzonatate (TESSALON) 100 MG capsule, Take 1 capsule (100 mg total) by mouth 3 (three) times daily as needed for cough. (Patient not taking: Reported on 01/15/2024), Disp: 30 capsule, Rfl: 0   Continuous Blood Gluc Sensor (DEXCOM G7 SENSOR) MISC, Place 1 sensor on the skin every 10 days (Patient not taking: Reported on 01/15/2024), Disp: 3 each, Rfl: 6   Continuous Glucose Sensor (DEXCOM G6 SENSOR) MISC, Change every 10 Days., Disp: 3 each, Rfl: 5   Continuous Glucose Transmitter (DEXCOM G6 TRANSMITTER) MISC, USE AS DIRECTED, REUSE FOR 3 MONTHS, Disp: 1 each, Rfl: 1   Glucagon (BAQSIMI TWO PACK) 3 MG/DOSE POWD, Place 1 application  into the nose as needed. Use as directed if unconscious, unable to take food po, or having a seizure due to hypoglycemia, Disp: 2 each, Rfl: 1   glucose blood (ACCU-CHEK GUIDE) test strip, Use to check BG 6 times daily, Disp: 200 each, Rfl: 8   insulin aspart (NOVOLOG) 100 UNIT/ML injection, Inject up to 200 units into insulin pump every 2 days. Please fill for insulin VIAL. (Patient not taking: Reported on 01/15/2024), Disp: 30 mL, Rfl: 5   Insulin Disposable Pump (OMNIPOD 5 G6 INTRO, GEN 5,) KIT, Inject 1 kit into the skin as directed. . Change pod every 2 days. Intro kit comes with 2 boxes  of pods, PDM device, pod pals, and user manual. Please fill for Omnipod 5 Into kit NDC [phone] (Patient not taking: Reported on 01/15/2024), Disp: 1 kit, Rfl: 2   Insulin Glargine (LANTUS SOLOSTAR) 100 UNIT/ML Solostar Pen, Up to 50 units per day as directed by MD (Patient not taking: Reported on 06/18/2023), Disp: 15 mL, Rfl: 3   NOVOLOG FLEXPEN 100 UNIT/ML FlexPen, INJECT UP TO 50 UNITS DAILY PER PROVIDER INSTRUCTION, Disp: 15 mL, Rfl: 5   TRESIBA FLEXTOUCH 100 UNIT/ML FlexTouch Pen, ADMINISTER UP TO 50 UNITS UNDER THE SKIN DAILY AS DIRECTED BY PRESCRIBER, Disp: 45 mL, Rfl: 6   Medications ordered in this encounter:  No orders of the defined types were placed in this encounter.    *If you need refills on other medications prior to your next appointment, please contact your pharmacy*  Follow-Up: Call back or seek an in-person evaluation if the symptoms worsen or if the condition fails to improve as anticipated.  [facility] [phone]  Other Instructions  I have sent a work note to your [name]. You can find by going to the Menu on your homepage, scrolling down to the Communications section, and selecting Letters. Let us know if you have any issue locating. Take care and feel better soon!      If you have been instructed to have an in-person evaluation today at a local Urgent Care facility, please use the link below. It will take you to a list of  all of our available [facility], including address, phone number and hours of operation. Please do not delay care.  [facility]  If you or a family member do not have a primary care provider, use the link below to schedule a visit and establish care. When you choose a [place] primary care physician or advanced practice provider, you gain a long-term partner in health. Find a Primary Care Provider  Learn more about [facility]'s in-office and virtual care options: [facility] - Get Care  Now

## 2024-02-18 NOTE — Progress Notes (Signed)
 Virtual Visit Consent   Michael [name], you are scheduled for a virtual visit with a [facility] provider today. Just as with appointments in the office, your consent must be obtained to participate. Your consent will be active for this visit and any virtual visit you may have with one of our providers in the next 365 days. If you have a MyChart account, a copy of this consent can be sent to you electronically.  As this is a virtual visit, video technology does not allow for your provider to perform a traditional examination. This may limit your provider's ability to fully assess your condition. If your provider identifies any concerns that need to be evaluated in person or the need to arrange testing (such as labs, EKG, etc.), we will make arrangements to do so. Although advances in technology are sophisticated, we cannot ensure that it will always work on either your end or our end. If the connection with a video visit is poor, the visit may have to be switched to a telephone visit. With either a video or telephone visit, we are not always able to ensure that we have a secure connection.  By engaging in this virtual visit, you consent to the provision of healthcare and authorize for your insurance to be billed (if applicable) for the services provided during this visit. Depending on your insurance coverage, you may receive a charge related to this service.  I need to obtain your verbal consent now. Are you willing to proceed with your visit today? Michael [name] has provided verbal consent on 02/18/2024 for a virtual visit (video or telephone). Freddy Finner, NP  Date: 02/18/2024 10:17 AM   Virtual Visit via Video Note   I, Freddy Finner, connected with  Michael [name]  ([id number], 2001-10-29) on 02/18/24 at 10:15 AM EDT by a video-enabled telemedicine application and verified that I am speaking with the correct person using two identifiers.  Location: Patient: Virtual Visit Location Patient:  Home Provider: Virtual Visit Location Provider: Home Office   I discussed the limitations of evaluation and management by telemedicine and the availability of in person appointments. The patient expressed understanding and agreed to proceed.    History of Present Illness: Michael [name] is a 23 y.o. who identifies as a male who was assigned male at birth, and is being seen today for   Onset was 6am with nausea and vomiting, diarrhea, related to eating some chicken and rice and beans last night around 10pm.  Associated symptoms are none Modifying factors are nothing Denies chest pain, shortness of breath, fevers, chills  Improved in the last hour, needs a note for work.  Problems:  Patient Active Problem List   Diagnosis Date Noted   Concussion wth loss of consciousness of 30 minutes or less 10/03/2020   Newly diagnosed diabetes (HCC) 12/11/2018   Weakness 12/11/2018   Oral thrush 12/11/2018   DKA, type 1 (HCC) 12/08/2018   DKA (diabetic ketoacidosis) (HCC) 12/08/2018    Allergies:  Allergies  Allergen Reactions   Penicillins Hives and Swelling    DID THE REACTION INVOLVE: Swelling of the face/tongue/throat, SOB, or low BP? Yes Sudden or severe rash/hives, skin peeling, or the inside of the mouth or nose? Yes Did it require medical treatment? Yes When did it last happen?       If all above answers are "NO", may proceed with cephalosporin use.    Medications:  Current Outpatient Medications:    benzonatate (TESSALON) 100 MG capsule,  Take 1 capsule (100 mg total) by mouth 3 (three) times daily as needed for cough. (Patient not taking: Reported on 01/15/2024), Disp: 30 capsule, Rfl: 0   Continuous Blood Gluc Sensor (DEXCOM G7 SENSOR) MISC, Place 1 sensor on the skin every 10 days (Patient not taking: Reported on 01/15/2024), Disp: 3 each, Rfl: 6   Continuous Glucose Sensor (DEXCOM G6 SENSOR) MISC, Change every 10 Days., Disp: 3 each, Rfl: 5   Continuous Glucose Transmitter (DEXCOM  G6 TRANSMITTER) MISC, USE AS DIRECTED, REUSE FOR 3 MONTHS, Disp: 1 each, Rfl: 1   Glucagon (BAQSIMI TWO PACK) 3 MG/DOSE POWD, Place 1 application  into the nose as needed. Use as directed if unconscious, unable to take food po, or having a seizure due to hypoglycemia, Disp: 2 each, Rfl: 1   glucose blood (ACCU-CHEK GUIDE) test strip, Use to check BG 6 times daily, Disp: 200 each, Rfl: 8   insulin aspart (NOVOLOG) 100 UNIT/ML injection, Inject up to 200 units into insulin pump every 2 days. Please fill for insulin VIAL. (Patient not taking: Reported on 01/15/2024), Disp: 30 mL, Rfl: 5   Insulin Disposable Pump (OMNIPOD 5 G6 INTRO, GEN 5,) KIT, Inject 1 kit into the skin as directed. . Change pod every 2 days. Intro kit comes with 2 boxes of pods, PDM device, pod pals, and user manual. Please fill for Omnipod 5 Into kit NDC [phone] (Patient not taking: Reported on 01/15/2024), Disp: 1 kit, Rfl: 2   Insulin Glargine (LANTUS SOLOSTAR) 100 UNIT/ML Solostar Pen, Up to 50 units per day as directed by MD (Patient not taking: Reported on 06/18/2023), Disp: 15 mL, Rfl: 3   NOVOLOG FLEXPEN 100 UNIT/ML FlexPen, INJECT UP TO 50 UNITS DAILY PER PROVIDER INSTRUCTION, Disp: 15 mL, Rfl: 5   TRESIBA FLEXTOUCH 100 UNIT/ML FlexTouch Pen, ADMINISTER UP TO 50 UNITS UNDER THE SKIN DAILY AS DIRECTED BY PRESCRIBER, Disp: 45 mL, Rfl: 6  Observations/Objective: Patient is well-developed, well-nourished in no acute distress.  Resting comfortably  at home.  Head is normocephalic, atraumatic.  No labored breathing.  Speech is clear and coherent with logical content.  Patient is alert and oriented at baseline.    Assessment and Plan:  1. Nausea and vomiting, unspecified vomiting type (Primary)  Hydrate and rest today Improved but work is asking for a note This was provided  Patient acknowledged agreement and understanding of the plan.      Follow Up Instructions: I discussed the assessment and treatment plan  with the patient. The patient was provided an opportunity to ask questions and all were answered. The patient agreed with the plan and demonstrated an understanding of the instructions.  A copy of instructions were sent to the patient via MyChart unless otherwise noted below.    The patient was advised to call back or seek an in-person evaluation if the symptoms worsen or if the condition fails to improve as anticipated.    Freddy Finner, NP

## 2024-03-07 ENCOUNTER — Telehealth: Admitting: Family

## 2024-03-07 DIAGNOSIS — A084 Viral intestinal infection, unspecified: Secondary | ICD-10-CM

## 2024-03-07 MED ORDER — ONDANSETRON HCL 4 MG PO TABS: 4.0000 mg | ORAL_TABLET | Freq: Three times a day (TID) | ORAL | 0 refills | Status: AC | PRN

## 2024-03-07 NOTE — Progress Notes (Signed)
 Virtual Visit Consent   Michael [name], you are scheduled for a virtual visit with a [facility] provider today. Just as with appointments in the office, your consent must be obtained to participate. Your consent will be active for this visit and any virtual visit you may have with one of our providers in the next 365 days. If you have a MyChart account, a copy of this consent can be sent to you electronically.  As this is a virtual visit, video technology does not allow for your provider to perform a traditional examination. This may limit your provider's ability to fully assess your condition. If your provider identifies any concerns that need to be evaluated in person or the need to arrange testing (such as labs, EKG, etc.), we will make arrangements to do so. Although advances in technology are sophisticated, we cannot ensure that it will always work on either your end or our end. If the connection with a video visit is poor, the visit may have to be switched to a telephone visit. With either a video or telephone visit, we are not always able to ensure that we have a secure connection.  By engaging in this virtual visit, you consent to the provision of healthcare and authorize for your insurance to be billed (if applicable) for the services provided during this visit. Depending on your insurance coverage, you may receive a charge related to this service.  I need to obtain your verbal consent now. Are you willing to proceed with your visit today? Michael [name] has provided verbal consent on 03/07/2024 for a virtual visit (video or telephone). Michael Rodney, FNP  Date: 03/07/2024 9:09 AM   Virtual Visit via Video Note   I, Michael [name], connected with  Michael [name]  ([id number], 02/08/2001) on 03/07/24 at  9:00 AM EDT by a video-enabled telemedicine application and verified that I am speaking with the correct person using two identifiers.  Location: Patient: Virtual Visit Location Patient:  Home Provider: Virtual Visit Location Provider: Home Office   I discussed the limitations of evaluation and management by telemedicine and the availability of in person appointments. The patient expressed understanding and agreed to proceed.    History of Present Illness: Michael [name] is a 23 y.o. who identifies as a male who was assigned male at birth, and is being seen today for nausea and vomiting that started 2 AM . Reports feeling now after resting.  HPI: Emesis  This is a new problem. The current episode started today. The problem occurs 2 to 4 times per day. The problem has been unchanged. The emesis has an appearance of stomach contents. There has been no fever. Associated symptoms include diarrhea. Pertinent negatives include no abdominal pain, chest pain, chills, coughing, dizziness or fever. He has tried bed rest for the symptoms. The treatment provided mild relief.    Problems:  Patient Active Problem List   Diagnosis Date Noted   Concussion wth loss of consciousness of 30 minutes or less 10/03/2020   Newly diagnosed diabetes (HCC) 12/11/2018   Weakness 12/11/2018   Oral thrush 12/11/2018   DKA, type 1 (HCC) 12/08/2018   DKA (diabetic ketoacidosis) (HCC) 12/08/2018    Allergies:  Allergies  Allergen Reactions   Penicillins Hives and Swelling    DID THE REACTION INVOLVE: Swelling of the face/tongue/throat, SOB, or low BP? Yes Sudden or severe rash/hives, skin peeling, or the inside of the mouth or nose? Yes Did it require medical treatment? Yes When did it last  happen?       If all above answers are "NO", may proceed with cephalosporin use.    Medications:  Current Outpatient Medications:    ondansetron (ZOFRAN) 4 MG tablet, Take 1 tablet (4 mg total) by mouth every 8 (eight) hours as needed for nausea or vomiting., Disp: 20 tablet, Rfl: 0   benzonatate (TESSALON) 100 MG capsule, Take 1 capsule (100 mg total) by mouth 3 (three) times daily as needed for cough. (Patient  not taking: Reported on 01/15/2024), Disp: 30 capsule, Rfl: 0   Continuous Blood Gluc Sensor (DEXCOM G7 SENSOR) MISC, Place 1 sensor on the skin every 10 days (Patient not taking: Reported on 01/15/2024), Disp: 3 each, Rfl: 6   Continuous Glucose Sensor (DEXCOM G6 SENSOR) MISC, Change every 10 Days., Disp: 3 each, Rfl: 5   Continuous Glucose Transmitter (DEXCOM G6 TRANSMITTER) MISC, USE AS DIRECTED, REUSE FOR 3 MONTHS, Disp: 1 each, Rfl: 1   Glucagon (BAQSIMI TWO PACK) 3 MG/DOSE POWD, Place 1 application  into the nose as needed. Use as directed if unconscious, unable to take food po, or having a seizure due to hypoglycemia, Disp: 2 each, Rfl: 1   glucose blood (ACCU-CHEK GUIDE) test strip, Use to check BG 6 times daily, Disp: 200 each, Rfl: 8   insulin aspart (NOVOLOG) 100 UNIT/ML injection, Inject up to 200 units into insulin pump every 2 days. Please fill for insulin VIAL. (Patient not taking: Reported on 01/15/2024), Disp: 30 mL, Rfl: 5   Insulin Disposable Pump (OMNIPOD 5 G6 INTRO, GEN 5,) KIT, Inject 1 kit into the skin as directed. . Change pod every 2 days. Intro kit comes with 2 boxes of pods, PDM device, pod pals, and user manual. Please fill for Omnipod 5 Into kit NDC [phone] (Patient not taking: Reported on 01/15/2024), Disp: 1 kit, Rfl: 2   Insulin Glargine (LANTUS SOLOSTAR) 100 UNIT/ML Solostar Pen, Up to 50 units per day as directed by MD (Patient not taking: Reported on 06/18/2023), Disp: 15 mL, Rfl: 3   NOVOLOG FLEXPEN 100 UNIT/ML FlexPen, INJECT UP TO 50 UNITS DAILY PER PROVIDER INSTRUCTION, Disp: 15 mL, Rfl: 5   TRESIBA FLEXTOUCH 100 UNIT/ML FlexTouch Pen, ADMINISTER UP TO 50 UNITS UNDER THE SKIN DAILY AS DIRECTED BY PRESCRIBER, Disp: 45 mL, Rfl: 6  Observations/Objective: Patient is well-developed, well-nourished in no acute distress.  Resting comfortably  at home.  Head is normocephalic, atraumatic.  No labored breathing.  Speech is clear and coherent with logical content.   Patient is alert and oriented at baseline.    Assessment and Plan: 1. Viral gastroenteritis (Primary) - ondansetron (ZOFRAN) 4 MG tablet; Take 1 tablet (4 mg total) by mouth every 8 (eight) hours as needed for nausea or vomiting.  Dispense: 20 tablet; Refill: 0  Rest Force fluids Tylenol  Zofran as needed [place] Work note given FOllow up if symptoms worsen or do not improve   Follow Up Instructions: I discussed the assessment and treatment plan with the patient. The patient was provided an opportunity to ask questions and all were answered. The patient agreed with the plan and demonstrated an understanding of the instructions.  A copy of instructions were sent to the patient via MyChart unless otherwise noted below.     The patient was advised to call back or seek an in-person evaluation if the symptoms worsen or if the condition fails to improve as anticipated.    Michael Rodney, FNP

## 2024-03-07 NOTE — Patient Instructions (Signed)

## 2024-04-20 ENCOUNTER — Telehealth: Admitting: Family Medicine

## 2024-04-20 DIAGNOSIS — K529 Noninfective gastroenteritis and colitis, unspecified: Secondary | ICD-10-CM

## 2024-04-20 NOTE — Progress Notes (Signed)
 Virtual Visit Consent   Michael [name], you are scheduled for a virtual visit with a [facility] provider today. Just as with appointments in the office, your consent must be obtained to participate. Your consent will be active for this visit and any virtual visit you may have with one of our providers in the next 365 days. If you have a MyChart account, a copy of this consent can be sent to you electronically.  As this is a virtual visit, video technology does not allow for your provider to perform a traditional examination. This may limit your provider's ability to fully assess your condition. If your provider identifies any concerns that need to be evaluated in person or the need to arrange testing (such as labs, EKG, etc.), we will make arrangements to do so. Although advances in technology are sophisticated, we cannot ensure that it will always work on either your end or our end. If the connection with a video visit is poor, the visit may have to be switched to a telephone visit. With either a video or telephone visit, we are not always able to ensure that we have a secure connection.  By engaging in this virtual visit, you consent to the provision of healthcare and authorize for your insurance to be billed (if applicable) for the services provided during this visit. Depending on your insurance coverage, you may receive a charge related to this service.  I need to obtain your verbal consent now. Are you willing to proceed with your visit today? Michael [name] has provided verbal consent on 04/20/2024 for a virtual visit (video or telephone). Michael Pion, NP  Date: 04/20/2024 9:46 AM   Virtual Visit via Video Note   I, Michael [name], connected with  Michael [name]  ([id number], 01-Oct-2001) on 04/20/24 at  9:45 AM EDT by a video-enabled telemedicine application and verified that I am speaking with the correct person using two identifiers.  Location: Patient: Virtual Visit Location Patient:  Home Provider: Virtual Visit Location Provider: Home Office   I discussed the limitations of evaluation and management by telemedicine and the availability of in person appointments. The patient expressed understanding and agreed to proceed.    History of Present Illness: Michael [name] is a 23 y.o. who identifies as a male who was assigned male at birth, and is being seen today for diarrhea.  Onset was after eating chicken- within 2 hours of eating was having some diarrhea all night. Last episode was 30 mins ago. Reports trying pepto and it slowed down some.  Denies fevers, chills, nausea or vomiting. 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    Problems:  Patient Active Problem List   Diagnosis Date Noted   Concussion wth loss of consciousness of 30 minutes or less 10/03/2020   Newly diagnosed diabetes (HCC) 12/11/2018   Weakness 12/11/2018   Oral thrush 12/11/2018   DKA, type 1 (HCC) 12/08/2018   DKA (diabetic ketoacidosis) (HCC) 12/08/2018    Allergies:   Allergies  Allergen Reactions   Penicillins Hives and Swelling    DID THE REACTION INVOLVE: Swelling of the face/tongue/throat, SOB, or low BP? Yes Sudden or severe rash/hives, skin peeling, or the inside of the mouth or nose? Yes Did it require medical treatment? Yes When did it last happen?       If all above answers are "NO", may proceed with cephalosporin use.  Medications:  Current Outpatient Medications:    benzonatate  (TESSALON ) 100 MG capsule, Take 1 capsule (100 mg total) by mouth 3 (three) times daily as needed for cough. (Patient not taking: Reported on 01/15/2024), Disp: 30 capsule, Rfl: 0   Continuous Blood Gluc Sensor (DEXCOM G7 SENSOR) MISC, Place 1 sensor on the skin every 10 days (Patient not taking: Reported on 01/15/2024), Disp: 3 each, Rfl: 6   Continuous Glucose Sensor (DEXCOM G6 SENSOR) MISC, Change every 10 Days., Disp: 3  each, Rfl: 5   Continuous Glucose Transmitter (DEXCOM G6 TRANSMITTER) MISC, USE AS DIRECTED, REUSE FOR 3 MONTHS, Disp: 1 each, Rfl: 1   Glucagon  (BAQSIMI  TWO PACK) 3 MG/DOSE POWD, Place 1 application  into the nose as needed. Use as directed if unconscious, unable to take food po, or having a seizure due to hypoglycemia, Disp: 2 each, Rfl: 1   glucose blood (ACCU-CHEK GUIDE) test strip, Use to check BG 6 times daily, Disp: 200 each, Rfl: 8   insulin  aspart (NOVOLOG ) 100 UNIT/ML injection, Inject up to 200 units into insulin  pump every 2 days. Please fill for insulin  VIAL. (Patient not taking: Reported on 01/15/2024), Disp: 30 mL, Rfl: 5   Insulin  Disposable Pump (OMNIPOD 5 G6 INTRO, GEN 5,) KIT, Inject 1 kit into the skin as directed. . Change pod every 2 days. Intro kit comes with 2 boxes of pods, PDM device, pod pals, and user manual. Please fill for Omnipod 5 Into kit NDC [phone] (Patient not taking: Reported on 01/15/2024), Disp: 1 kit, Rfl: 2   Insulin  Glargine (LANTUS  SOLOSTAR) 100 UNIT/ML Solostar Pen, Up to 50 units per day as directed by MD (Patient not taking: Reported on 06/18/2023), Disp: 15 mL, Rfl: 3   NOVOLOG  FLEXPEN 100 UNIT/ML FlexPen, INJECT UP TO 50 UNITS DAILY PER PROVIDER INSTRUCTION, Disp: 15 mL, Rfl: 5   ondansetron  (ZOFRAN ) 4 MG tablet, Take 1 tablet (4 mg total) by mouth every 8 (eight) hours as needed for nausea or vomiting., Disp: 20 tablet, Rfl: 0   TRESIBA  FLEXTOUCH 100 UNIT/ML FlexTouch Pen, ADMINISTER UP TO 50 UNITS UNDER THE SKIN DAILY AS DIRECTED BY PRESCRIBER, Disp: 45 mL, Rfl: 6  Observations/Objective: Patient is well-developed, well-nourished in no acute distress.  Resting comfortably  at home.  Head is normocephalic, atraumatic.  No labored breathing.  Speech is clear and coherent with logical content.  Patient is alert and oriented at baseline.    Assessment and Plan:  1. Gastroenteritis (Primary)  -post meal upset -rest -hydrate -bland diet -  work note  Reviewed side effects, risks and benefits of medication.    Patient acknowledged agreement and understanding of the plan.   Past Medical, Surgical, Social History, Allergies, and Medications have been Reviewed.    Follow Up Instructions: I discussed the assessment and treatment plan with the patient. The patient was provided an opportunity to ask questions and all were answered. The patient agreed with the plan and demonstrated an understanding of the instructions.  A copy of instructions were sent to the patient via MyChart unless otherwise noted below.    The patient was advised to call back or seek an in-person evaluation if the symptoms worsen or if the condition fails to improve as anticipated.    Michael Pion, NP

## 2024-04-20 NOTE — Patient Instructions (Addendum)
 Michael [name], thank you for joining Michael Pion, NP for today's virtual visit.  While this provider is not your primary care provider (PCP), if your PCP is located in our provider database this encounter information will be shared with them immediately following your visit.   A [name] account gives you access to today's visit and all your visits, tests, and labs performed at [facility] " click here if you don't have a [name] account or go to [email or web address]  Consent: (Patient) Michael [name] provided verbal consent for this virtual visit at the beginning of the encounter.  Current Medications:  Current Outpatient Medications:    benzonatate  (TESSALON ) 100 MG capsule, Take 1 capsule (100 mg total) by mouth 3 (three) times daily as needed for cough. (Patient not taking: Reported on 01/15/2024), Disp: 30 capsule, Rfl: 0   Continuous Blood Gluc Sensor (DEXCOM G7 SENSOR) MISC, Place 1 sensor on the skin every 10 days (Patient not taking: Reported on 01/15/2024), Disp: 3 each, Rfl: 6   Continuous Glucose Sensor (DEXCOM G6 SENSOR) MISC, Change every 10 Days., Disp: 3 each, Rfl: 5   Continuous Glucose Transmitter (DEXCOM G6 TRANSMITTER) MISC, USE AS DIRECTED, REUSE FOR 3 MONTHS, Disp: 1 each, Rfl: 1   Glucagon  (BAQSIMI  TWO PACK) 3 MG/DOSE POWD, Place 1 application  into the nose as needed. Use as directed if unconscious, unable to take food po, or having a seizure due to hypoglycemia, Disp: 2 each, Rfl: 1   glucose blood (ACCU-CHEK GUIDE) test strip, Use to check BG 6 times daily, Disp: 200 each, Rfl: 8   insulin  aspart (NOVOLOG ) 100 UNIT/ML injection, Inject up to 200 units into insulin  pump every 2 days. Please fill for insulin  VIAL. (Patient not taking: Reported on 01/15/2024), Disp: 30 mL, Rfl: 5   Insulin  Disposable Pump (OMNIPOD 5 G6 INTRO, GEN 5,) KIT, Inject 1 kit into the skin as directed. . Change pod every 2 days. Intro kit comes with 2 boxes  of pods, PDM device, pod pals, and user manual. Please fill for Omnipod 5 Into kit NDC [phone] (Patient not taking: Reported on 01/15/2024), Disp: 1 kit, Rfl: 2   Insulin  Glargine (LANTUS  SOLOSTAR) 100 UNIT/ML Solostar Pen, Up to 50 units per day as directed by MD (Patient not taking: Reported on 06/18/2023), Disp: 15 mL, Rfl: 3   NOVOLOG  FLEXPEN 100 UNIT/ML FlexPen, INJECT UP TO 50 UNITS DAILY PER PROVIDER INSTRUCTION, Disp: 15 mL, Rfl: 5   ondansetron  (ZOFRAN ) 4 MG tablet, Take 1 tablet (4 mg total) by mouth every 8 (eight) hours as needed for nausea or vomiting., Disp: 20 tablet, Rfl: 0   TRESIBA  FLEXTOUCH 100 UNIT/ML FlexTouch Pen, ADMINISTER UP TO 50 UNITS UNDER THE SKIN DAILY AS DIRECTED BY PRESCRIBER, Disp: 45 mL, Rfl: 6   Medications ordered in this encounter:  No orders of the defined types were placed in this encounter.    *If you need refills on other medications prior to your next appointment, please contact your pharmacy*  Follow-Up: Call back or seek an in-person evaluation if the symptoms worsen or if the condition fails to improve as anticipated.  [facility] [phone]  Other Instructions  -post meal upset -rest -hydrate -bland diet - work note    If you have been instructed to have an in-person evaluation today at a local Urgent Care facility, please use the link below. It will take you to a list of all of our available [facility]  Urgent Cares, including address, phone number and hours of operation. Please do not delay care.  [facility]  If you or a family member do not have a primary care provider, use the link below to schedule a visit and establish care. When you choose a [facility] primary care physician or advanced practice provider, you gain a long-term partner in health. Find a Primary Care Provider  Learn more about [facility]'s in-office and virtual care options:  - Get Care Now

## 2024-05-11 ENCOUNTER — Telehealth (INDEPENDENT_AMBULATORY_CARE_PROVIDER_SITE_OTHER): Payer: Self-pay | Admitting: Pediatrics

## 2024-05-11 MED ORDER — PEN NEEDLES 32G X 4 MM MISC: 5 refills | Status: AC

## 2024-05-11 NOTE — Telephone Encounter (Signed)
 Who's calling (name and relationship to patient) : Theodis Fiscal; [facility]  Best contact number: [phone]  Provider they see: Dr. Cleora Daft  Reason for call: Lelani called in stating that Jacoby called in a refill for BD needles for insulin .    Call ID:      PRESCRIPTION REFILL ONLY  Name of prescription:  Pharmacy:

## 2024-06-08 ENCOUNTER — Other Ambulatory Visit (INDEPENDENT_AMBULATORY_CARE_PROVIDER_SITE_OTHER): Payer: Self-pay

## 2024-06-08 DIAGNOSIS — E109 Type 1 diabetes mellitus without complications: Secondary | ICD-10-CM

## 2024-06-14 ENCOUNTER — Telehealth (INDEPENDENT_AMBULATORY_CARE_PROVIDER_SITE_OTHER): Payer: Self-pay

## 2024-06-14 ENCOUNTER — Other Ambulatory Visit (HOSPITAL_COMMUNITY): Payer: Self-pay

## 2024-06-14 DIAGNOSIS — E109 Type 1 diabetes mellitus without complications: Secondary | ICD-10-CM

## 2024-06-14 MED ORDER — NOVOLOG FLEXPEN 100 UNIT/ML ~~LOC~~ SOPN
PEN_INJECTOR | SUBCUTANEOUS | 0 refills | Status: AC
  Filled 2024-06-14: qty 15, 30d supply, fill #0
  Filled 2024-06-14 – 2024-08-03 (×2): qty 15, fill #0
  Filled 2024-08-03: qty 15, 30d supply, fill #0

## 2024-06-14 NOTE — Telephone Encounter (Signed)
Reill sent in.

## 2024-06-17 ENCOUNTER — Telehealth (INDEPENDENT_AMBULATORY_CARE_PROVIDER_SITE_OTHER): Payer: Self-pay | Admitting: Pediatrics

## 2024-06-17 NOTE — Telephone Encounter (Signed)
 Mallie with [facility] called to see if provider received the request for a prescription refill. She would like a callback at [phone].

## 2024-06-18 NOTE — Telephone Encounter (Signed)
 Called the pharmacy and they said the novalog was denied due to seeing a different provider I informed hr that there was a referral placed back in February.She said that she would reach out to the patient and to see if he's seeing a different endo.

## 2024-06-23 ENCOUNTER — Other Ambulatory Visit (HOSPITAL_COMMUNITY): Payer: Self-pay

## 2024-08-03 ENCOUNTER — Other Ambulatory Visit: Payer: Self-pay

## 2024-08-03 ENCOUNTER — Other Ambulatory Visit (HOSPITAL_COMMUNITY): Payer: Self-pay

## 2024-08-06 ENCOUNTER — Other Ambulatory Visit (INDEPENDENT_AMBULATORY_CARE_PROVIDER_SITE_OTHER): Payer: Self-pay

## 2024-10-13 ENCOUNTER — Telehealth

## 2024-11-07 ENCOUNTER — Other Ambulatory Visit (INDEPENDENT_AMBULATORY_CARE_PROVIDER_SITE_OTHER): Payer: Self-pay

## 2024-11-08 ENCOUNTER — Other Ambulatory Visit (HOSPITAL_COMMUNITY): Payer: Self-pay

## 2024-11-09 ENCOUNTER — Ambulatory Visit (HOSPITAL_COMMUNITY)
Admission: EM | Admit: 2024-11-09 | Discharge: 2024-11-09 | Disposition: A | Discharge status: Home / Self Care | Attending: Physician Assistant

## 2024-11-09 VITALS — BP 117/76 | HR 74 | Temp 98.4°F | Resp 14 | Wt 134.0 lb

## 2024-11-09 DIAGNOSIS — E109 Type 1 diabetes mellitus without complications: Secondary | ICD-10-CM | POA: Diagnosis not present

## 2024-11-09 LAB — POCT URINE DIPSTICK
Bilirubin, UA: NEGATIVE
Blood, UA: NEGATIVE
Glucose, UA: >=1000 mg/dL — AB
Leukocytes, UA: NEGATIVE
Nitrite, UA: NEGATIVE
POC PROTEIN,UA: NEGATIVE
Spec Grav, UA: 1.01 (ref 1.010–1.025)
Urobilinogen, UA: 0.2 U/dL
pH, UA: 5.5 (ref 5.0–8.0)

## 2024-11-09 LAB — GLUCOSE, POCT (MANUAL RESULT ENTRY): POC Glucose: 463 mg/dL — AB (ref 70–99)

## 2024-11-09 MED ORDER — NOVOLOG FLEXPEN 100 UNIT/ML ~~LOC~~ SOPN: PEN_INJECTOR | SUBCUTANEOUS | 0 refills | Status: AC

## 2024-11-09 NOTE — Discharge Instructions (Addendum)
 VISIT SUMMARY:  You came in today because your blood sugar levels have been high since you ran out of your NovoLog  insulin  on Monday, November 08, 2024. You have been experiencing increased urination and a headache, which you believe are due to the high blood sugar. You have a history of diabetic ketoacidosis (DKA) and currently manage your diabetes with NovoLog  and Tresiba  using a FlexPen.  YOUR PLAN:  -TYPE 1 DIABETES MELLITUS: Type 1 diabetes is a condition where your body does not produce insulin , a hormone needed to control blood sugar levels. You ran out of NovoLog  insulin , leading to high blood sugar levels and symptoms like increased urination and headache. A prescription for NovoLog  has been sent to provide a 30-day supply. You should stay hydrated and watch for signs of dehydration or worsening DKA, such as dry mouth, rapid heart rate, or nausea. If these symptoms worsen, seek immediate medical attention. You should also contact your primary care office for an urgent referral to an endocrinologist for further management and adjustments to your treatment plan. Rest and avoid strenuous activities until your blood sugar levels stabilize.  INSTRUCTIONS:  Please follow up with your primary care office for an urgent endocrinology referral. Seek immediate medical attention if you experience symptoms of worsening DKA, such as nausea, vomiting, or rapid heart rate.

## 2024-11-09 NOTE — ED Provider Notes (Signed)
 [facility]    CSN: [id number] Arrival date & time: 11/09/24  1602      History   Chief Complaint Chief Complaint  Patient presents with   Medication Refill    HPI Michael [name] is a 23 y.o. male.  has a past medical history of Type 1 diabetes (HCC).   HPI  Discussed the use of AI scribe software for clinical note transcription with the patient, who gave verbal consent to proceed.     Past Medical History:  Diagnosis Date   Type 1 diabetes (HCC)    Dx 12/2018, presented with DKA.  + Insulin  Ab and GAD Ab.  Low C-peptide.  Negative celiac screen.    Patient Active Problem List   Diagnosis Date Noted   Concussion wth loss of consciousness of 30 minutes or less 10/03/2020   Newly diagnosed diabetes (HCC) 12/11/2018   Weakness 12/11/2018   Oral thrush 12/11/2018   DKA, type 1 (HCC) 12/08/2018   DKA (diabetic ketoacidosis) (HCC) 12/08/2018    History reviewed. No pertinent surgical history.     Home Medications    Prior to Admission medications   Medication Sig Start Date End Date Taking? Authorizing Provider  Continuous Glucose Sensor (DEXCOM G6 SENSOR) MISC Change every 10 Days. 01/15/24  Yes Willo Rosina Kurk, MD  insulin  aspart (NOVOLOG  FLEXPEN) 100 UNIT/ML FlexPen Please follow your dosing instructions based on carbohydrate intake per your Endocrinology provider. 11/09/24  Yes Deral Schellenberg E, PA-C  benzonatate  (TESSALON ) 100 MG capsule Take 1 capsule (100 mg total) by mouth 3 (three) times daily as needed for cough. Patient not taking: Reported on 11/09/2024 12/25/23   Gladis Elsie BROCKS, PA-C  Continuous Blood Gluc Sensor (DEXCOM G7 SENSOR) MISC Place 1 sensor on the skin every 10 days Patient not taking: Reported on 11/09/2024 12/13/22   Willo Rosina Kurk, MD  Continuous Glucose Transmitter (DEXCOM G6 TRANSMITTER) MISC USE AS DIRECTED, REUSE FOR 3 MONTHS 01/15/24   Willo Rosina Kurk, MD  Glucagon  (BAQSIMI  TWO PACK) 3 MG/DOSE POWD Place  1 application  into the nose as needed. Use as directed if unconscious, unable to take food po, or having a seizure due to hypoglycemia 01/15/24   Willo Rosina Kurk, MD  glucose blood (ACCU-CHEK GUIDE) test strip Use to check BG 6 times daily 03/23/20   Willo Rosina Kurk, MD  Insulin  Disposable Pump (OMNIPOD 5 G6 INTRO, GEN 5,) KIT Inject 1 kit into the skin as directed. . Change pod every 2 days. Intro kit comes with 2 boxes of pods, PDM device, pod pals, and user manual. Please fill for Omnipod 5 Into kit [facility] [id number] Patient not taking: Reported on 01/15/2024 12/17/21   Willo Rosina Kurk, MD  Insulin  Glargine (LANTUS  SOLOSTAR) 100 UNIT/ML Solostar Pen Up to 50 units per day as directed by MD Patient not taking: Reported on 06/18/2023 12/09/18   Willo Rosina Kurk, MD  Insulin  Pen Needle (PEN NEEDLES) 32G X 4 MM MISC Use to inject insulin  up to 6x daily per provider guidance 05/11/24   Delayne Sharper, MD  NOVOLOG  FLEXPEN 100 UNIT/ML FlexPen INJECT UP TO 50 UNITS DAILY PER PROVIDER INSTRUCTION 06/14/24   Arlana LILLETTE Lenis, MD  ondansetron  (ZOFRAN ) 4 MG tablet Take 1 tablet (4 mg total) by mouth every 8 (eight) hours as needed for nausea or vomiting. 03/07/24   Lavell Lye A, FNP  TRESIBA  FLEXTOUCH 100 UNIT/ML FlexTouch Pen ADMINISTER UP TO 50 UNITS UNDER THE SKIN DAILY AS DIRECTED BY PRESCRIBER  06/20/23   Willo Rosina Kurk, MD    Family History Family History  Problem Relation Age of Onset   Diabetes Paternal Aunt    Diabetes Paternal Uncle    Diabetes Paternal Grandmother     Social History Social History   Tobacco Use   Smoking status: Never    Passive exposure: Never   Smokeless tobacco: Never  Vaping Use   Vaping status: Never Used  Substance Use Topics   Alcohol  use: Never    Alcohol /week: 0.0 standard drinks of alcohol    Drug use: Yes    Types: Marijuana     Allergies   Penicillins   Review of Systems Review of Systems  Constitutional:   Negative for chills and fever.  Respiratory:  Negative for shortness of breath and wheezing.   Gastrointestinal:  Negative for abdominal pain, diarrhea, nausea and vomiting.  Endocrine: Positive for polyuria.  Skin:  Negative for rash.  Neurological:  Positive for headaches.     Physical Exam Triage Vital Signs ED Triage Vitals  Encounter Vitals Group     BP 11/09/24 1710 117/76     Girls Systolic BP Percentile --      Girls Diastolic BP Percentile --      Boys Systolic BP Percentile --      Boys Diastolic BP Percentile --      Pulse Rate 11/09/24 1710 74     Resp 11/09/24 1710 14     Temp 11/09/24 1710 98.4 F (36.9 C)     Temp Source 11/09/24 1710 Oral     SpO2 11/09/24 1710 96 %     Weight 11/09/24 1710 134 lb (60.8 kg)     Height --      Head Circumference --      Peak Flow --      Pain Score 11/09/24 1722 5     Pain Loc --      Pain Education --      Exclude from Growth Chart --    No data found.  Updated Vital Signs BP 117/76 (BP Location: Right Arm)   Pulse 74   Temp 98.4 F (36.9 C) (Oral)   Resp 14   Wt 134 lb (60.8 kg)   SpO2 96%   BMI 20.37 kg/m   Visual Acuity Right Eye Distance:   Left Eye Distance:   Bilateral Distance:    Right Eye Near:   Left Eye Near:    Bilateral Near:     Physical Exam Vitals reviewed.  Constitutional:      General: He is awake.     Appearance: Normal appearance. He is well-developed and well-groomed.  HENT:     Head: Normocephalic and atraumatic.     Mouth/Throat:     Lips: Pink.     Mouth: Mucous membranes are moist. No oral lesions.  Eyes:     Extraocular Movements: Extraocular movements intact.     Conjunctiva/sclera: Conjunctivae normal.  Cardiovascular:     Rate and Rhythm: Normal rate and regular rhythm.     Heart sounds: Normal heart sounds. No murmur heard.    No friction rub. No gallop.  Pulmonary:     Effort: Pulmonary effort is normal.     Breath sounds: Normal breath sounds. No decreased air  movement. No decreased breath sounds, wheezing, rhonchi or rales.  Musculoskeletal:     Cervical back: Normal range of motion.  Skin:    General: Skin is warm and dry.  Neurological:  Mental Status: He is alert and oriented to person, place, and time.  Psychiatric:        Attention and Perception: Attention normal.        Mood and Affect: Mood normal.        Speech: Speech normal.        Behavior: Behavior normal. Behavior is cooperative.      UC Treatments / Results  Labs (all labs ordered are listed, but only abnormal results are displayed) Labs Reviewed  GLUCOSE, POCT (MANUAL RESULT ENTRY) - Abnormal; Notable for the following components:      Result Value   POC Glucose 463 (*)    All other components within normal limits  POCT URINE DIPSTICK - Abnormal; Notable for the following components:   Color, UA light yellow (*)    Glucose, UA >=1,000 (*)    Ketones, POC UA moderate (40) (*)    All other components within normal limits    EKG   Radiology No results found.  Procedures Procedures (including critical care time)  Medications Ordered in UC Medications - No data to display  Initial Impression / Assessment and Plan / UC Course  I have reviewed the triage vital signs and the nursing notes.  Pertinent labs & imaging results that were available during my care of the patient were reviewed by me and considered in my medical decision making (see chart for details).      Final Clinical Impressions(s) / UC Diagnoses   Final diagnoses:  Type 1 diabetes mellitus without complications [facility])   Discharge Instructions   None    ED Prescriptions     Medication Sig Dispense Auth. Provider   insulin  aspart (NOVOLOG  FLEXPEN) 100 UNIT/ML FlexPen Please follow your dosing instructions based on carbohydrate intake per your Endocrinology provider. 15 mL Rochele Lueck E, PA-C      PDMP not reviewed this encounter.

## 2024-11-09 NOTE — ED Triage Notes (Addendum)
 He went to the pharmacy today to get more insulin  (Novolog ), alcohol  swabs, and needles. His doctor stepped down therefore does not have a PCP right now. He states he ran out of insulin  around Monday. He states he has not had much to eat today. He states he has a headache but has not other new symptoms.

## 2024-11-11 ENCOUNTER — Other Ambulatory Visit (INDEPENDENT_AMBULATORY_CARE_PROVIDER_SITE_OTHER): Payer: Self-pay

## 2024-11-11 ENCOUNTER — Other Ambulatory Visit (HOSPITAL_COMMUNITY): Payer: Self-pay

## 2024-11-12 ENCOUNTER — Other Ambulatory Visit (HOSPITAL_COMMUNITY): Payer: Self-pay
